# Patient Record
Sex: Female | Born: 1975 | Race: White | Hispanic: No | Smoking: Never smoker
Health system: Southern US, Community
[De-identification: ages and names within clinical notes are randomized; demographics above are authoritative.]

## PROBLEM LIST (undated history)

## (undated) DIAGNOSIS — F909 Attention-deficit hyperactivity disorder, unspecified type: Secondary | ICD-10-CM

## (undated) DIAGNOSIS — I872 Venous insufficiency (chronic) (peripheral): Secondary | ICD-10-CM

## (undated) DIAGNOSIS — D649 Anemia, unspecified: Secondary | ICD-10-CM

## (undated) DIAGNOSIS — Z8742 Personal history of other diseases of the female genital tract: Secondary | ICD-10-CM

## (undated) DIAGNOSIS — G43909 Migraine, unspecified, not intractable, without status migrainosus: Secondary | ICD-10-CM

## (undated) DIAGNOSIS — T7840XA Allergy, unspecified, initial encounter: Secondary | ICD-10-CM

## (undated) DIAGNOSIS — F32A Depression, unspecified: Secondary | ICD-10-CM

## (undated) DIAGNOSIS — F329 Major depressive disorder, single episode, unspecified: Secondary | ICD-10-CM

## (undated) DIAGNOSIS — D259 Leiomyoma of uterus, unspecified: Secondary | ICD-10-CM

## (undated) DIAGNOSIS — N809 Endometriosis, unspecified: Secondary | ICD-10-CM

## (undated) DIAGNOSIS — F419 Anxiety disorder, unspecified: Secondary | ICD-10-CM

## (undated) HISTORY — DX: Venous insufficiency (chronic) (peripheral): I87.2

## (undated) HISTORY — PX: DILATION AND CURETTAGE OF UTERUS: SHX78

## (undated) HISTORY — DX: Endometriosis, unspecified: N80.9

## (undated) HISTORY — DX: Depression, unspecified: F32.A

## (undated) HISTORY — DX: Migraine, unspecified, not intractable, without status migrainosus: G43.909

## (undated) HISTORY — DX: Major depressive disorder, single episode, unspecified: F32.9

## (undated) HISTORY — DX: Depression, unspecified: F41.9

## (undated) HISTORY — DX: Leiomyoma of uterus, unspecified: D25.9

## (undated) HISTORY — PX: BREAST LUMPECTOMY W/ NEEDLE LOCALIZATION: SHX1266

## (undated) HISTORY — DX: Attention-deficit hyperactivity disorder, unspecified type: F90.9

## (undated) HISTORY — DX: Allergy, unspecified, initial encounter: T78.40XA

## (undated) HISTORY — PX: TONSILLECTOMY: SUR1361

## (undated) HISTORY — DX: Anemia, unspecified: D64.9

## (undated) HISTORY — DX: Personal history of other diseases of the female genital tract: Z87.42

---

## 1997-10-19 ENCOUNTER — Inpatient Hospital Stay (HOSPITAL_COMMUNITY): Admission: AD | Admit: 1997-10-19 | Discharge: 1997-10-22 | Payer: Self-pay | Admitting: Obstetrics and Gynecology

## 1998-09-07 ENCOUNTER — Inpatient Hospital Stay (HOSPITAL_COMMUNITY): Admission: AD | Admit: 1998-09-07 | Discharge: 1998-09-09 | Payer: Self-pay | Admitting: Obstetrics and Gynecology

## 1999-01-26 ENCOUNTER — Encounter (INDEPENDENT_AMBULATORY_CARE_PROVIDER_SITE_OTHER): Payer: Self-pay

## 1999-01-26 ENCOUNTER — Other Ambulatory Visit: Admission: RE | Admit: 1999-01-26 | Discharge: 1999-01-26 | Payer: Self-pay | Admitting: *Deleted

## 1999-08-17 ENCOUNTER — Encounter (INDEPENDENT_AMBULATORY_CARE_PROVIDER_SITE_OTHER): Payer: Self-pay | Admitting: Specialist

## 1999-08-17 ENCOUNTER — Inpatient Hospital Stay (HOSPITAL_COMMUNITY): Admission: AD | Admit: 1999-08-17 | Discharge: 1999-08-19 | Payer: Self-pay | Admitting: Obstetrics and Gynecology

## 1999-10-23 ENCOUNTER — Encounter: Payer: Self-pay | Admitting: Obstetrics and Gynecology

## 1999-10-23 ENCOUNTER — Ambulatory Visit (HOSPITAL_COMMUNITY): Admission: RE | Admit: 1999-10-23 | Discharge: 1999-10-23 | Payer: Self-pay | Admitting: Obstetrics and Gynecology

## 1999-11-15 ENCOUNTER — Other Ambulatory Visit: Admission: RE | Admit: 1999-11-15 | Discharge: 1999-11-15 | Payer: Self-pay | Admitting: Obstetrics and Gynecology

## 2000-10-21 ENCOUNTER — Other Ambulatory Visit: Admission: RE | Admit: 2000-10-21 | Discharge: 2000-10-21 | Payer: Self-pay | Admitting: Obstetrics and Gynecology

## 2000-10-22 ENCOUNTER — Encounter (INDEPENDENT_AMBULATORY_CARE_PROVIDER_SITE_OTHER): Payer: Self-pay

## 2000-10-22 ENCOUNTER — Ambulatory Visit (HOSPITAL_COMMUNITY): Admission: RE | Admit: 2000-10-22 | Discharge: 2000-10-22 | Payer: Self-pay | Admitting: Obstetrics and Gynecology

## 2002-08-16 ENCOUNTER — Emergency Department (HOSPITAL_COMMUNITY): Admission: EM | Admit: 2002-08-16 | Discharge: 2002-08-16 | Payer: Self-pay

## 2002-10-14 ENCOUNTER — Other Ambulatory Visit: Admission: RE | Admit: 2002-10-14 | Discharge: 2002-10-14 | Payer: Self-pay | Admitting: Obstetrics and Gynecology

## 2003-05-05 ENCOUNTER — Inpatient Hospital Stay (HOSPITAL_COMMUNITY): Admission: AD | Admit: 2003-05-05 | Discharge: 2003-05-05 | Payer: Self-pay | Admitting: Obstetrics and Gynecology

## 2003-06-06 ENCOUNTER — Inpatient Hospital Stay (HOSPITAL_COMMUNITY): Admission: AD | Admit: 2003-06-06 | Discharge: 2003-06-08 | Payer: Self-pay | Admitting: Obstetrics and Gynecology

## 2003-07-13 ENCOUNTER — Other Ambulatory Visit: Admission: RE | Admit: 2003-07-13 | Discharge: 2003-07-13 | Payer: Self-pay | Admitting: Obstetrics and Gynecology

## 2005-01-03 ENCOUNTER — Ambulatory Visit: Payer: Self-pay | Admitting: Family Medicine

## 2005-07-09 ENCOUNTER — Ambulatory Visit: Payer: Self-pay | Admitting: Family Medicine

## 2005-11-13 ENCOUNTER — Ambulatory Visit: Payer: Self-pay | Admitting: Family Medicine

## 2006-01-08 ENCOUNTER — Ambulatory Visit: Payer: Self-pay | Admitting: Family Medicine

## 2006-01-15 ENCOUNTER — Ambulatory Visit: Payer: Self-pay | Admitting: Internal Medicine

## 2006-01-21 ENCOUNTER — Ambulatory Visit: Payer: Self-pay | Admitting: Family Medicine

## 2006-01-21 ENCOUNTER — Encounter: Admission: RE | Admit: 2006-01-21 | Discharge: 2006-01-21 | Payer: Self-pay | Admitting: Family Medicine

## 2006-01-24 ENCOUNTER — Ambulatory Visit: Payer: Self-pay | Admitting: Family Medicine

## 2006-01-28 ENCOUNTER — Ambulatory Visit: Payer: Self-pay | Admitting: Family Medicine

## 2006-02-05 ENCOUNTER — Encounter: Admission: RE | Admit: 2006-02-05 | Discharge: 2006-02-05 | Payer: Self-pay | Admitting: Family Medicine

## 2006-02-05 ENCOUNTER — Ambulatory Visit: Payer: Self-pay | Admitting: Family Medicine

## 2006-02-25 ENCOUNTER — Encounter: Admission: RE | Admit: 2006-02-25 | Discharge: 2006-02-25 | Payer: Self-pay | Admitting: Allergy and Immunology

## 2006-03-01 ENCOUNTER — Ambulatory Visit: Payer: Self-pay | Admitting: Family Medicine

## 2006-09-16 ENCOUNTER — Ambulatory Visit: Payer: Self-pay | Admitting: Family Medicine

## 2006-11-26 DIAGNOSIS — J45909 Unspecified asthma, uncomplicated: Secondary | ICD-10-CM | POA: Insufficient documentation

## 2007-06-17 ENCOUNTER — Ambulatory Visit: Payer: Self-pay | Admitting: Family Medicine

## 2007-06-17 DIAGNOSIS — J029 Acute pharyngitis, unspecified: Secondary | ICD-10-CM | POA: Insufficient documentation

## 2007-06-17 DIAGNOSIS — R609 Edema, unspecified: Secondary | ICD-10-CM | POA: Insufficient documentation

## 2007-06-17 LAB — CONVERTED CEMR LAB: Rapid Strep: NEGATIVE

## 2008-03-03 ENCOUNTER — Ambulatory Visit: Payer: Self-pay | Admitting: Family Medicine

## 2008-03-09 ENCOUNTER — Encounter (INDEPENDENT_AMBULATORY_CARE_PROVIDER_SITE_OTHER): Payer: Self-pay | Admitting: *Deleted

## 2008-03-09 ENCOUNTER — Telehealth: Payer: Self-pay | Admitting: Family Medicine

## 2008-03-29 ENCOUNTER — Ambulatory Visit: Payer: Self-pay | Admitting: Surgery

## 2008-07-19 DIAGNOSIS — J069 Acute upper respiratory infection, unspecified: Secondary | ICD-10-CM | POA: Insufficient documentation

## 2008-07-26 ENCOUNTER — Ambulatory Visit: Payer: Self-pay | Admitting: Family Medicine

## 2008-08-13 IMAGING — CR DG CHEST 2V
2 series · 2 of 2 positions shown · non-contrast
Comparison: None.

CLINICAL DATA: Cough, chest pain, and wheezing.
 TWO VIEW CHEST:

[w chest pa]
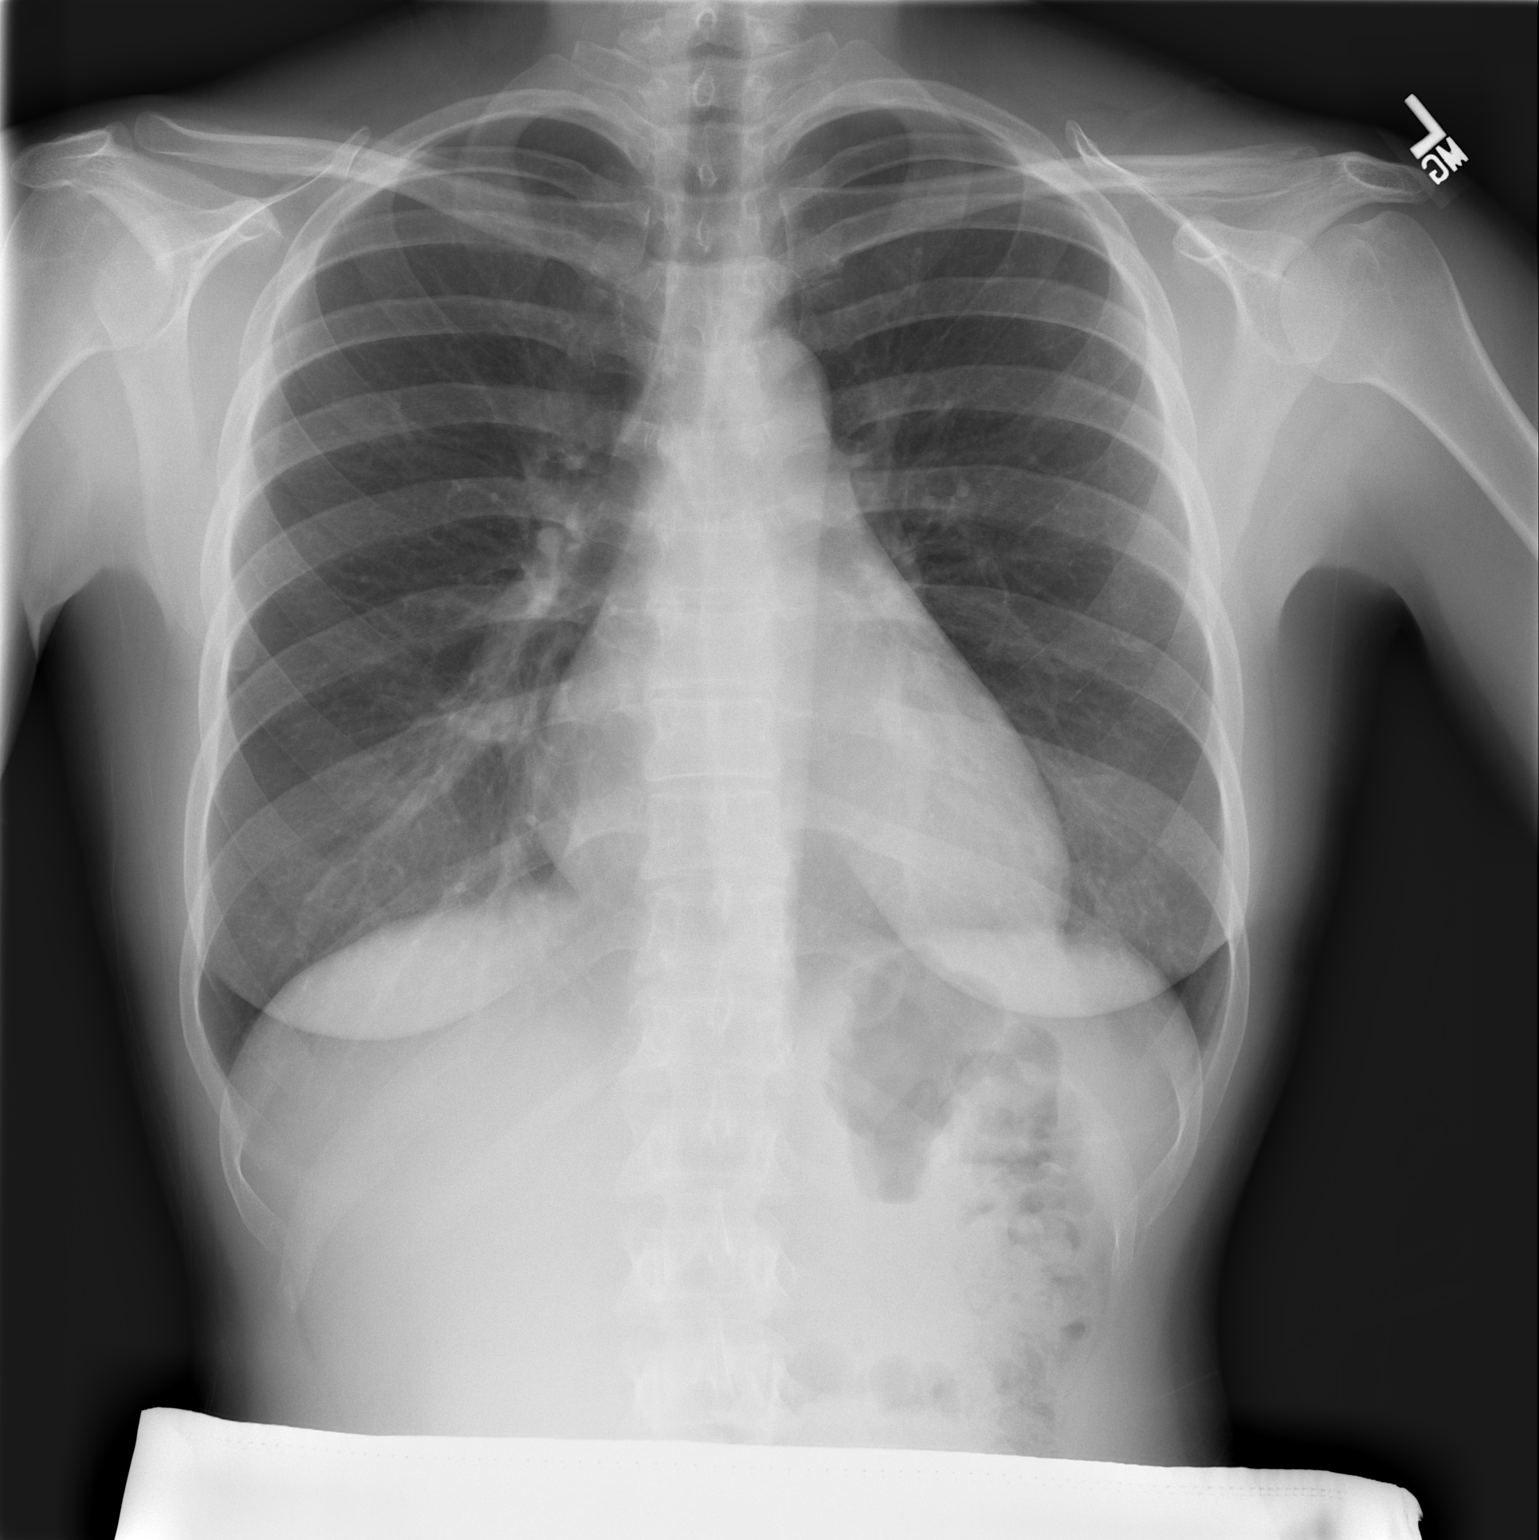

[w chest lat]
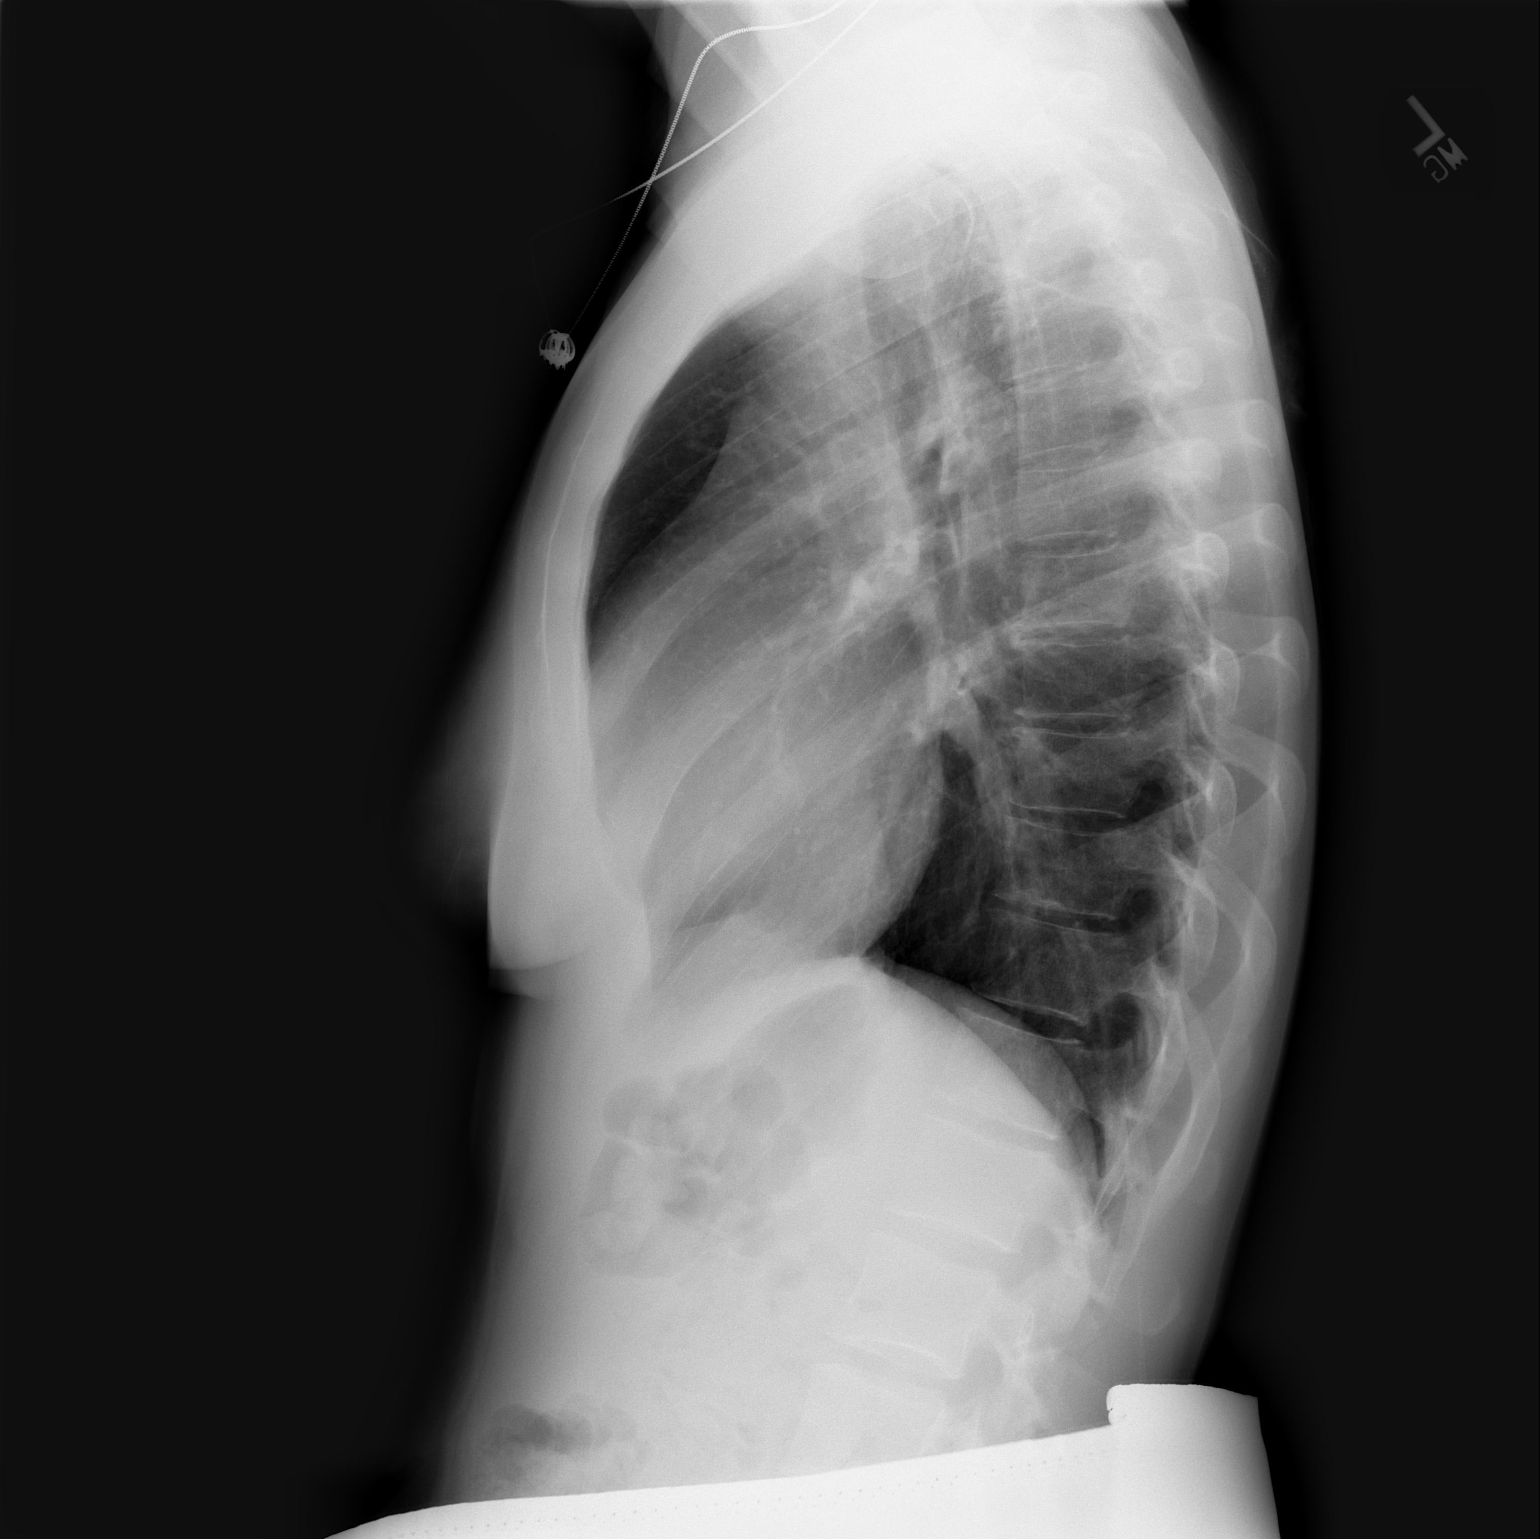

[2 of 2 positions shown; findings below may reference images not displayed]

Heart size is upper limits of normal.  Mild peribronchial thickening is noted without focal airspace disease.  A faint nodular opacity overlying the left upper lobe is identified. The lungs are otherwise clear.  No pleural effusions or pneumothorax.  The bony thorax and upper abdomen are within normal limits.
IMPRESSION: 1.  Mild peribronchial thickening without focal airspace disease.
 2.  Vaguely nodular opacity overlying the left upper lobe ? probably artifact but recommend follow-up chest x-ray in 8-12 weeks.
 3.  Upper limits of normal heart size.

## 2009-02-17 ENCOUNTER — Ambulatory Visit: Payer: Self-pay | Admitting: Family Medicine

## 2009-02-17 DIAGNOSIS — B070 Plantar wart: Secondary | ICD-10-CM | POA: Insufficient documentation

## 2009-09-13 ENCOUNTER — Ambulatory Visit: Payer: Self-pay | Admitting: Internal Medicine

## 2009-09-13 DIAGNOSIS — J309 Allergic rhinitis, unspecified: Secondary | ICD-10-CM | POA: Insufficient documentation

## 2009-12-26 ENCOUNTER — Ambulatory Visit: Payer: Self-pay | Admitting: Family Medicine

## 2009-12-26 DIAGNOSIS — R1011 Right upper quadrant pain: Secondary | ICD-10-CM | POA: Insufficient documentation

## 2009-12-26 DIAGNOSIS — G43109 Migraine with aura, not intractable, without status migrainosus: Secondary | ICD-10-CM | POA: Insufficient documentation

## 2009-12-26 LAB — CONVERTED CEMR LAB
ALT: 20 units/L (ref 0–35)
AST: 20 units/L (ref 0–37)
Albumin: 4.6 g/dL (ref 3.5–5.2)
Alkaline Phosphatase: 52 units/L (ref 39–117)
Amylase: 29 units/L (ref 27–131)
BUN: 13 mg/dL (ref 6–23)
Basophils Absolute: 0 10*3/uL (ref 0.0–0.1)
Basophils Relative: 0.4 % (ref 0.0–3.0)
Bilirubin Urine: NEGATIVE
Bilirubin, Direct: 0.2 mg/dL (ref 0.0–0.3)
Blood in Urine, dipstick: NEGATIVE
CO2: 29 meq/L (ref 19–32)
Calcium: 9.6 mg/dL (ref 8.4–10.5)
Chloride: 104 meq/L (ref 96–112)
Creatinine, Ser: 0.7 mg/dL (ref 0.4–1.2)
Eosinophils Absolute: 0 10*3/uL (ref 0.0–0.7)
Eosinophils Relative: 0.1 % (ref 0.0–5.0)
GFR calc non Af Amer: 109.84 mL/min (ref >60)
Glucose, Bld: 80 mg/dL (ref 70–99)
Glucose, Urine, Semiquant: NEGATIVE
HCT: 40.2 % (ref 36.0–46.0)
Hemoglobin: 13.9 g/dL (ref 12.0–15.0)
Ketones, urine, test strip: NEGATIVE
Lipase: 25 units/L (ref 11.0–59.0)
Lymphocytes Relative: 17.2 % (ref 12.0–46.0)
Lymphs Abs: 1.2 10*3/uL (ref 0.7–4.0)
MCHC: 34.5 g/dL (ref 30.0–36.0)
MCV: 92.8 fL (ref 78.0–100.0)
Monocytes Absolute: 0.4 10*3/uL (ref 0.1–1.0)
Monocytes Relative: 5.3 % (ref 3.0–12.0)
Neutro Abs: 5.6 10*3/uL (ref 1.4–7.7)
Neutrophils Relative %: 77 % (ref 43.0–77.0)
Nitrite: NEGATIVE
Platelets: 234 10*3/uL (ref 150.0–400.0)
Potassium: 3.7 meq/L (ref 3.5–5.1)
Protein, U semiquant: NEGATIVE
RBC: 4.34 M/uL (ref 3.87–5.11)
RDW: 12.5 % (ref 11.5–14.6)
Sodium: 140 meq/L (ref 135–145)
Specific Gravity, Urine: <1.005
Total Bilirubin: 1.1 mg/dL (ref 0.3–1.2)
Total Protein: 7.1 g/dL (ref 6.0–8.3)
Urobilinogen, UA: 0.2
WBC Urine, dipstick: NEGATIVE
WBC: 7.3 10*3/uL (ref 4.5–10.5)
pH: 5.5

## 2009-12-27 ENCOUNTER — Encounter: Admission: RE | Admit: 2009-12-27 | Discharge: 2009-12-27 | Payer: Self-pay | Admitting: Family Medicine

## 2009-12-27 ENCOUNTER — Telehealth: Payer: Self-pay | Admitting: Family Medicine

## 2009-12-28 ENCOUNTER — Encounter: Payer: Self-pay | Admitting: Family Medicine

## 2009-12-29 ENCOUNTER — Telehealth: Payer: Self-pay | Admitting: Gastroenterology

## 2009-12-29 ENCOUNTER — Ambulatory Visit (HOSPITAL_COMMUNITY): Admission: RE | Admit: 2009-12-29 | Discharge: 2009-12-29 | Payer: Self-pay | Admitting: Family Medicine

## 2009-12-29 ENCOUNTER — Ambulatory Visit: Payer: Self-pay | Admitting: Gastroenterology

## 2010-01-03 ENCOUNTER — Telehealth: Payer: Self-pay | Admitting: Gastroenterology

## 2010-01-04 ENCOUNTER — Telehealth: Payer: Self-pay | Admitting: Family Medicine

## 2010-01-05 ENCOUNTER — Encounter: Payer: Self-pay | Admitting: Family Medicine

## 2010-04-18 NOTE — Assessment & Plan Note (Signed)
Summary: legs swollen/mhf   Vital Signs:  Patient Profile:   35 Years Old Female Weight:      163 pounds Temp:     98.4 degrees F oral BP sitting:   142 / 90  (left arm) Cuff size:   regular  Vitals Entered By: Carol Beasley CMA (March 03, 2008 8:53 AM)                 Chief Complaint:  swelling with legs.  History of Present Illness: Carol Beasley is a 35 year old, married female, [employer] firefighter, G4, P4, who comes in today with a 6 month history of bilateral leg edema.  We noticed this last spring.  We gave her HCTZ, 25 mg to take p.r.n.  She took a p.r.n. but it didn't help.  She then took a daily and her legs still swell  She is  on a salt free diet.  She's never had trouble with varicose veins.  Her father and a brother both have hypertension. she's otherwise in excellent health.  Her husband had a vasectomy.    Updated Prior Medication List: ALBUTEROL 90 MCG/ACT  AERS (ALBUTEROL) as needed HYDROCHLOROTHIAZIDE 25 MG  TABS (HYDROCHLOROTHIAZIDE) Take 1 tablet by mouth every morning * IRON take one tab two every other day * BIO FLEX take one tablet once daily  Current Allergies: No known allergies   Past Medical History:    Reviewed history from 06/17/2007 and no changes required:       Asthma       childbirth x 4  Past Surgical History:    Reviewed history from 11/26/2006 and no changes required:       CB X4   Family History:    Reviewed history from 06/17/2007 and no changes required:       father in good health.  No problems       mother has hypertension       one brother two sisters, all in good health       Family History Hypertension  Social History:    Reviewed history from 06/17/2007 and no changes required:       Occupation: fireman       Married       Never Smoked       Alcohol use-no       Drug use-no       Regular exercise-yes    Review of Systems      See HPI   Physical Exam  General:     Well-developed,well-nourished,in no acute  distress; alert,appropriate and cooperative throughout examination Neck:     No deformities, masses, or tenderness noted. Chest Wall:     No deformities, masses, or tenderness noted. Lungs:     Normal respiratory effort, chest expands symmetrically. Lungs are clear to auscultation, no crackles or wheezes. Heart:     Normal rate and regular rhythm. S1 and S2 normal without gallop, murmur, click, rub or other extra sounds. 160/90 right arm sitting position 150/9090 left arm sitting position Abdomen:     Bowel sounds positive,abdomen soft and non-tender without masses, organomegaly or hernias noted. Extremities:     1+ left pedal edema and trace right pedal edema.      Impression & Recommendations:  Problem # 1:  PERIPHERAL EDEMA (ICD-782.3) Assessment: Deteriorated  Her updated medication list for this problem includes:    Hydrochlorothiazide 25 Mg Tabs (Hydrochlorothiazide) .Marland Kitchen... Take 1 tablet by mouth every morning    Furosemide 20 Mg Tabs (  Furosemide) .Marland Kitchen... Take 1 tablet by mouth every morning   Complete Medication List: 1)  Albuterol 90 Mcg/act Aers (Albuterol) .... As needed 2)  Hydrochlorothiazide 25 Mg Tabs (Hydrochlorothiazide) .... Take 1 tablet by mouth every morning 3)  Iron  .... Take one tab two every other day 4)  Bio Flex  .... Take one tablet once daily 5)  Furosemide 20 Mg Tabs (Furosemide) .... Take 1 tablet by mouth every morning   Patient Instructions: 1)  stop the HCTZ begin Lasix 20 mg every morning.  Stent a complete salt free diet.  Check a morning blood pressure daily.  Return in 4 weeks for follow-up when he returned to bring a record of all your blood pressure readings   Prescriptions: FUROSEMIDE 20 MG TABS (FUROSEMIDE) Take 1 tablet by mouth every morning  #100 x 3   Entered and Authorized by:   Roderick Pee MD   Signed by:   Roderick Pee MD on 03/03/2008   Method used:   [facility] to        [facility] [phone][facility] (retail)       [address] [place], [place]  [address]       Ph: [phone] or [phone]       Fax: [phone]   RxID:   [id number]  ]

## 2010-04-18 NOTE — Progress Notes (Signed)
Summary: How pt is doing  ---- Converted from flag ---- ---- 12/30/2009 10:35 AM, Merri Ray CMA (AAMA) wrote: Call pt to see how she is doing. Dr Arlyce Dice wants to know ------------------------------  Phone Note Outgoing Call Call back at [facility] Phone [phone]   Call placed by: Merri Ray CMA Duncan Dull),  January 03, 2010 4:31 PM Summary of Call: Called pt to ask how she is doing no answer, L/M to return my call Initial call taken by: Merri Ray CMA Duncan Dull),  January 03, 2010 4:32 PM  Follow-up for Phone Call        tried to contact pt again L/M Follow-up by: Merri Ray CMA Duncan Dull),  January 04, 2010 10:04 AM  Additional Follow-up for Phone Call Additional follow up Details #1::        ok Additional Follow-up by: Louis Meckel MD,  January 04, 2010 11:13 AM    Additional Follow-up for Phone Call Additional follow up Details #2::    Waiting on call back.... Dr Arlyce Dice aware Follow-up by: Merri Ray CMA Duncan Dull),  January 04, 2010 1:43 PM

## 2010-04-18 NOTE — Assessment & Plan Note (Signed)
Summary: sore throat/mhf   Vital Signs:  Patient Profile:   35 Years Old Female Weight:      152.5 pounds Temp:     98.2 degrees F BP sitting:   130 / 62  Vitals Entered By: Sindy Guadeloupe RN (June 17, 2007 9:26 AM)                 Chief Complaint:  sore throat.  History of Present Illness: Carol Beasley is a 35 year old female, G4, P4 whose husbands have a vasectomy, who comes in today for evaluation of two problems.  She's had a sore throat, fever, chills, swollen lymph nodes for 3 days.  She does not have a headache, earache, cough, nausea, vomiting, or diarrhea.  All 4 children have been well.  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  She's also complaining of some fluid retention.  He used it occurs a week before her period.    Current Allergies (reviewed today): No known allergies   Past Medical History:    Reviewed history from 11/26/2006 and no changes required:       Asthma       childbirth x 4   Family History:    Reviewed history and no changes required:       father in good health.  No problems       mother has hypertension       one brother two sisters, all in good health  Social History:    Occupation: fireman    Married    Never Smoked    Alcohol use-no    Drug use-no    Regular exercise-yes   Risk Factors:  Tobacco use:  never Drug use:  no Alcohol use:  no Exercise:  yes   Review of Systems      See HPI   Physical Exam  General:     Well-developed,well-nourished,in no acute distress; alert,appropriate and cooperative throughout examination Head:     Normocephalic and atraumatic without obvious abnormalities. No apparent alopecia or balding. Eyes:     No corneal or conjunctival inflammation noted. EOMI. Perrla. Funduscopic exam benign, without hemorrhages, exudates or papilledema. Vision grossly normal. Ears:     External ear exam shows no significant  lesions or deformities.  Otoscopic examination reveals clear canals, tympanic membranes are intact bilaterally without bulging, retraction, inflammation or discharge. Hearing is grossly normal bilaterally. Nose:     External nasal examination shows no deformity or inflammation. Nasal mucosa are pink and moist without lesions or exudates. Mouth:     the throat is red and swollen.  No petechiae nor exudate Neck:     symmetrical 2+ cervical lymphadenopathy Pulses:     R and L carotid,radial,femoral,dorsalis pedis and posterior tibial pulses are full and equal bilaterally Extremities:     No clubbing, cyanosis, edema, or deformity noted with normal full range of motion of all joints.      Impression & Recommendations:  Problem # 1:  SORE THROAT (ICD-462) Assessment: New  Orders: Rapid Strep ([address])  Her updated medication list for this problem includes:    Amoxicillin 400 Mg Chew (Amoxicillin) .Marland Kitchen... Take 1 tablet by mouth three times a day   Problem # 2:  PERIPHERAL EDEMA (ICD-782.3) Assessment: New  Her updated medication list for this problem includes:    Hydrochlorothiazide 25 Mg Tabs (Hydrochlorothiazide) .Marland Kitchen... Take 1 tablet by mouth every morning  Complete Medication List: 1)  Albuterol 90 Mcg/act Aers (Albuterol) .... As needed 2)  Amoxicillin 400 Mg Chew (Amoxicillin) .... Take 1 tablet by mouth three times a day 3)  Hydrochlorothiazide 25 Mg Tabs (Hydrochlorothiazide) .... Take 1 tablet by mouth every morning   Patient Instructions: 1)  take amoxicillin chewable 13 times a day for 10 days. 2)  He may also take HCTZ 25 mg every morning as needed for fluid retention    Prescriptions: HYDROCHLOROTHIAZIDE 25 MG  TABS (HYDROCHLOROTHIAZIDE) Take 1 tablet by mouth every morning  #100 x 4   Entered and Authorized by:   Roderick Pee MD   Signed by:   Roderick Pee MD on 06/17/2007   Method used:   [facility] to ...       [id number]       [address]       [place], [place]  [address]       Ph: [phone] or [phone]       Fax: [phone]   RxID:   [id number] AMOXICILLIN 400 MG  CHEW (AMOXICILLIN) Take 1 tablet by mouth three times a day  #30 x 1   Entered and Authorized by:   Roderick Pee MD   Signed by:   Roderick Pee MD on 06/17/2007   Method used:   Electronically sent to ...       [facility] [phone][facility]       [address]       [place], [place]  [address]       Ph: [phone] or [phone]       Fax: [phone]   RxID:   [id number]  ]Pharmacy called and switched the medication to Amoxicillin 400/17ml suspension 1 tsp three times a day x10days Laboratory Results   Date/Time Reported: June 17, 2007 9:50 AM   Other Tests  Rapid Strep: negative Comments ..................................................................Marland KitchenWynona Canes, CMA  June 17, 2007 9:50 AM

## 2010-04-18 NOTE — Assessment & Plan Note (Signed)
Summary: ABD PAIN,NAUSEA UNABLE TO EAT/YF   History of Present Illness Visit Type: Initial Consult Primary GI MD: Melvia Heaps MD [facility] Primary Provider: Kelle Darting, MD Requesting Provider: Kelle Darting, MD Chief Complaint: RUQ pain, with nausea ; patient can eat only small amount of food History of Present Illness:   Carol Beasley is a 35 year old white female referred at the request of Dr. Tawanna Cooler for evaluation of nausea.  For the past 2 weeks she has been complaining of right upper quadrant pain.  Pain is only of mild to moderate intensity.  It is unaffected by eating.  She underwent an ultrasound that was unrevealing.  Pain has lessened to some degree and is now described as an aching pain.  Over the past several days she has had severe nausea.  Nausea has been around-the-clock and not affected by eating.  She has a history of migraine headaches but has had no recent severe headaches.  She has not had nausea accompaning  the headaches in the past.  She is on no gastric irritants nor on other new medications or supplements.  She is without fever.  Recent liver tests and CBC were normal.   GI Review of Systems    Reports abdominal pain, loss of appetite, and  nausea.     Location of  Abdominal pain: RUQ.    Denies acid reflux, belching, bloating, chest pain, dysphagia with liquids, dysphagia with solids, heartburn, vomiting, vomiting blood, weight loss, and  weight gain.        Denies anal fissure, black tarry stools, change in bowel habit, constipation, diarrhea, diverticulosis, fecal incontinence, heme positive stool, hemorrhoids, irritable bowel syndrome, jaundice, light color stool, liver problems, rectal bleeding, and  rectal pain.    Current Medications (verified): 1)  Albuterol 90 Mcg/act  Aers (Albuterol) .... As Needed 2)  Iron .... Take One Tab Two Every Other Day 3)  Zomig Zmt 2.5 Mg Tbdp (Zolmitriptan) .... Uad For  Migraines  Allergies (verified): No Known Drug  Allergies  Past History:  Past Medical History: Asthma childbirth x 4 Chronic Headaches  Past Surgical History: Reviewed history from 11/26/2006 and no changes required. CB X4  Family History: Reviewed history from 03/03/2008 and no changes required. father in good health.  No problems mother has hypertension one brother two sisters, all in good health Family History Hypertension  Social History: Reviewed history from 06/17/2007 and no changes required. 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  The patient denies allergy/sinus, anemia, anxiety-new, arthritis/joint pain, blood in urine, breast changes/lumps, change in vision, confusion, cough, coughing up blood, depression-new, fainting, fever, hearing problems, heart murmur, heart rhythm changes, itching, menstrual pain, muscle pains/cramps, night sweats, nosebleeds, pregnancy symptoms, shortness of breath, skin rash, sleeping problems, sore throat, swelling of feet/legs, swollen lymph glands, thirst - excessive , urination - excessive , urination changes/pain, urine leakage, vision changes, and voice change.         All other systems were reviewed and were negative   Vital Signs:  Patient profile:   35 year old female Height:      64 inches Weight:      153.13 pounds BMI:     26.38 Pulse rate:   72 / minute Pulse rhythm:   regular BP sitting:   110 / 80  (left arm) Cuff size:   regular  Vitals Entered  By: June McMurray CMA Duncan Dull) (December 29, 2009 9:01 AM)  Physical Exam  Additional Exam:  On physical exam she is well-developed large female  skin: anicteric HEENT: normocephalic; PEERLA; no nasal or pharyngeal abnormalities neck: supple nodes: no cervical lymphadenopathy chest: clear to ausculatation and percussion heart: no murmurs, gallops, or rubs abd: soft, nontender;  BS normoactive; no abdominal masses, organomegaly; there is a mild tenderness to palpation in the right upper quadrant at the subcostal margin.  There is no succussion splash. rectal: deferred ext: no cynanosis, clubbing, edema skeletal: no deformities neuro: oriented x 3; no focal abnormalities    Impression & Recommendations:  Problem # 1:  ABDOMINAL PAIN, RIGHT UPPER QUADRANT (ICD-789.01) Assessment New Abdominal pain and nausea could be a manifestation of a chronic cholecystitis, even in the absence of gallbladder stones.  Alternatively, in particular her nausea may be a migraine equivalent.  Recommendations #1 HIDA scan-this is been ordered for today #2 Zofran for nausea #3 if her scan is negative and symptoms not improved with Zofran  I would consider aggressively treatment for her  migraine headaches.  I would do an EGD first  Patient Instructions: 1)  Copy sent to : Kelle Darting, MD 2)  Please make sure that we recieve results of HIDA scan 3)  Call the office tomorrow and let us know how your doing 4)  You can pick up your new prescription from your pharmacy today 5)  The medication list was reviewed and reconciled.  All changed / newly prescribed medications were explained.  A complete medication list was provided to the patient / caregiver. Prescriptions: ZOFRAN 4 MG TABS (ONDANSETRON HCL) take one tab every 6 hours as needed for nausea  #20 x 1   Entered by:   Merri Ray CMA (AAMA)   Authorized by:   Louis Meckel MD   Signed by:   Merri Ray CMA (AAMA) on 12/29/2009   Method used:   [facility]        [facility] [phone][facility] ([facility]ail[facility]       [address] [place], [place]  [address]       Ph: [phone] or [phone]       Fax: [phone]   RxID:   [id number]

## 2010-04-18 NOTE — Miscellaneous (Signed)
Summary: Orders Update   Clinical Lists Changes  Orders: Added new Referral order of Radiology Referral (Radiology) - Signed 

## 2010-04-18 NOTE — Miscellaneous (Signed)
Summary: Orders Update  Clinical Lists Changes  Orders: Added new Referral order of Vascular Clinic (Vascular) - Signed

## 2010-04-18 NOTE — Assessment & Plan Note (Signed)
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  She states the past two weeks has had intermittent right upper quadrant abdominal pain.  She describes it as sometimes sharp sometimes dull.  It lasted 15 minutes and goes away.  Occasional wake her up at night.  She's had no fever, chills, nausea, vomiting, diarrhea, or urinary tract symptoms.  LMP two weeks ago, normal.  Negative family history for gallbladder disease.  She has light scan light.  Eyes and 4 children.  She's also complaining of increasing frequency of her migraines.  She is taking over-the-counter medications to no avail.  She does have a visual aura prior to the migraines  Current Medications (verified): 1)  Albuterol 90 Mcg/act  Aers (Albuterol) .... As Needed 2)  Iron .... Take One Tab Two Every Other Day  Allergies (verified): No Known Drug Allergies  Past History:  Past medical, surgical, family and social histories (including risk factors) reviewed, and no changes noted (except as noted below).  Past Medical History: Reviewed history from 06/17/2007 and no changes required. Asthma childbirth x 4  Past Surgical History: Reviewed history from 11/26/2006 and no changes required. CB X4  Family History: Reviewed history from 03/03/2008 and no changes required. father in good health.  No problems mother has hypertension one brother two sisters, all in good health Family History Hypertension  Social History: Reviewed history from 06/17/2007 and no changes required. 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 Lungs are clear to auscultation, no crackles or wheezes.   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 Uad for  migraines  Problem # 2:  ABDOMINAL PAIN, RIGHT UPPER QUADRANT (ICD-789.01) Assessment: New  Orders: Venipuncture ([address]) TLB-BMP (Basic Metabolic Panel-BMET) (80048-METABOL) TLB-CBC Platelet - w/Differential (85025-CBCD) TLB-Hepatic/Liver Function Pnl (80076-HEPATIC) TLB-Amylase (82150-AMYL) Radiology Referral (Radiology)  Complete Medication List: 1)  Albuterol 90 Mcg/act Aers (Albuterol) .... As needed 2)  Iron  .... Take one tab two every other day 3)  Zomig Zmt 2.5 Mg Tbdp (Zolmitriptan) .... Uad for  migraines  Other Orders: UA Dipstick w/o Micro (manual) ([address]) TLB-Lipase (83690-LIPASE) Prescription Created Electronically [phone])  Patient Instructions: 1)  stay on  a complete fat free diet...Marland KitchenMarland KitchenMarland Kitchenwe will get she is set up for an ultrasound of her gallbladder in the morning. 2)  Zomig or  Imitrex p.r.n. for migraines Prescriptions: ZOMIG ZMT 2.5 MG TBDP (ZOLMITRIPTAN) UAD for  migraines  #6 x 11   Entered and  Authorized by:   Roderick Pee MD   Signed by:   Roderick Pee MD on 12/26/2009   Method used:   [facility]        [facility] [phone][facility] ([facility]ail[facility]       [address] [place], [place]   [address]       Ph: [phone] or [phone]       Fax: [phone]   RxID:   [id number]   Laboratory Results   Urine Tests  Date/Time Recieved: December 26, 2009 11:14 AM  Date/Time Reported: December 26, 2009 11:13 AM   Routine Urinalysis   Color: yellow Appearance: Clear Glucose: negative   (Normal Range: Negative) Bilirubin: negative   (Normal Range: Negative) Ketone: negative   (Normal Range: Negative) Spec. Gravity: <1.005   (Normal Range: 1.003-1.035) Blood: negative   (Normal Range: Negative) pH: 5.5   (Normal Range: 5.0-8.0) Protein: negative   (Normal Range: Negative) Urobilinogen: 0.2   (Normal Range: 0-1) Nitrite: negative   (Normal Range: Negative) Leukocyte Esterace: negative   (Normal Range: Negative)    Comments: Wynona Canes, CMA  December 26, 2009 11:14 AM

## 2010-04-18 NOTE — Letter (Signed)
Summary: Results Letter  [facility]  [address] [place], [place] [address]   Phone: [phone]  Fax: [phone]        December 29, 2009 MRN: [id number]    [facility] [address] [place], [place]  [address]    Dear Ms. Poster,  It is my pleasure to have treated you recently as a new patient in my office. I appreciate your confidence and the opportunity to participate in your care.  Since I do have a busy inpatient endoscopy schedule and office schedule, my office hours vary weekly. I am, however, available for emergency calls everyday through my office. If I am not available for an urgent office appointment, another one of our gastroenterologist will be able to assist you.  My well-trained staff are prepared to help you at all times. For emergencies after office hours, a physician from our Gastroenterology section is always available through my 24 hour answering service  Once again I welcome you as a new patient and I look forward to a happy and healthy relationship             Sincerely,  Louis Meckel MD  This letter has been electronically signed by your physician.  Appended Document: Results Letter letter mailed

## 2010-04-18 NOTE — Progress Notes (Signed)
Summary: Called pt to inform  ---- Converted from flag ---- ---- 12/29/2009 4:45 PM, Louis Meckel MD wrote: Please ask patient to call back early next week to report her progress.  If not improved I would schedule an upper endoscopy.  Please tell her that her scan was normal. ------------------------------  Phone Note Outgoing Call Call back at [facility] Phone [phone]   Call placed by: Merri Ray CMA Duncan Dull),  December 29, 2009 4:54 PM Summary of Call: Called pt to inform to contact the office in 2 weeks and let us know how she is doing. Will schedule Upper Endoscopy if no better Initial call taken by: Merri Ray CMA Duncan Dull),  December 29, 2009 4:54 PM  Follow-up for Phone Call        I would have her call early next week Follow-up by: Louis Meckel MD,  December 30, 2009 10:31 AM  Additional Follow-up for Phone Call Additional follow up Details #1::        Ok I will contact the pt the first of next week myself. Will send reminder flag. Additional Follow-up by: Merri Ray CMA Duncan Dull),  December 30, 2009 10:34 AM

## 2010-04-18 NOTE — Medication Information (Signed)
Summary: PA & Approval for Zomig  PA & Approval for Zomig   Imported By: Maryln Gottron 01/09/2010 14:39:50  _____________________________________________________________________  External Attachment:    Type:   Image     Comment:   External Document

## 2010-04-18 NOTE — Assessment & Plan Note (Signed)
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  He comes in today for treatment of a wart on the right foot.  Allergies: No Known Drug Allergies  Physical Exam  General:  Well-developed,well-nourished,in no acute distress; alert,appropriate and cooperative throughout examination Skin:  plantar wart, right foot   Impression & Recommendations:  Problem # 1:  PLANTAR WART, RIGHT (GUY-403.47) Assessment New  Orders: Cryotherapy/Destruction benign or premalignant lesion (1st lesion)  (17000)  Complete Medication List: 1)  Albuterol 90 Mcg/act Aers (Albuterol) .... As needed 2)  Iron  .... Take one tab two every other day 3)  Qvar 40 Mcg/act Aers (Beclomethasone dipropionate) .... 2 ps two times a day 4)  Hydromet 5-1.5 Mg/8ml Syrp (Hydrocodone-homatropine) .Marland Kitchen.. 1 or 2 tsps at bedtime as needed  Patient Instructions: 1)  apply Duofilm and shaved weekly return p.r.n.

## 2010-04-18 NOTE — Progress Notes (Signed)
Summary: abd ultrasound  Phone Note Call from Patient Call back at [facility] Phone [phone] Call back at Work Phone [phone]   Caller: Patient Call For: Roderick Pee MD Summary of Call: pt would like abd ultrasound results Initial call taken by: Heron Sabins,  December 27, 2009 4:54 PM  Follow-up for Phone Call        the patient was contacted by me.  Her diagnostic studies were all normal will refer to GI for further evaluation Follow-up by: Roderick Pee MD,  December 27, 2009 5:08 PM

## 2010-04-18 NOTE — Assessment & Plan Note (Signed)
Summary: sinuses//ccm   Vital Signs:  Patient profile:   35 year old female Weight:      157 pounds Temp:     98.0 degrees F oral BP sitting:   118 / 80  (left arm) Cuff size:   regular  Vitals Entered By: Duard Brady LPN (September 13, 2009 10:10 AM) CC: c/o sinus pressure and headache , (R) ear pain Is Patient Diabetic? No   CC:  c/o sinus pressure and headache  and (R) ear pain.  History of Present Illness: 35 year old patient who has had sinus difficulties for approximate one month and she is evaluated at an urgent care.  Four weeks ago and has been on topical otic drops, as well as systemic anabolic therapy with amoxicillin.  She denies any fever or purulent sinus or ear drainage.  Complains include periodic headache, sinus pressure, and a popping and fullness in the right ear.  She has a history of asthma, which has been stable  Preventive Screening-Counseling & Management  Alcohol-Tobacco     Smoking Status: never  Allergies (verified): No Known Drug Allergies  Past History:  Past Medical History: Reviewed history from 06/17/2007 and no changes required. Asthma childbirth x 4  Physical Exam  General:  Well-developed,well-nourished,in no acute distress; alert,appropriate and cooperative throughout examination Head:  Normocephalic and atraumatic without obvious abnormalities. No apparent alopecia or balding. Eyes:  No corneal or conjunctival inflammation noted. EOMI. Perrla. Funduscopic exam benign, without hemorrhages, exudates or papilledema. Vision grossly normal. Ears:  External ear exam shows no significant lesions or deformities.  Otoscopic examination reveals clear canals, tympanic membranes are intact bilaterally without bulging, retraction, inflammation or discharge. Hearing is grossly normal bilaterally. both tympanic membranes appear to be mildly dull, but noninflamed Mouth:  Oral mucosa and oropharynx without lesions or exudates.  Teeth in good  repair. Neck:  No deformities, masses, or tenderness noted. Lungs:  Normal respiratory effort, chest expands symmetrically. Lungs are clear to auscultation, no crackles or wheezes.   Impression & Recommendations:  Problem # 1:  ASTHMA (ICD-493.90)  The following medications were removed from the medication list:    Qvar 40 Mcg/act Aers (Beclomethasone dipropionate) .Marland Kitchen... 2 ps two times a day Her updated medication list for this problem includes:    Albuterol 90 Mcg/act Aers (Albuterol) .Marland Kitchen... As needed  Problem # 2:  ALLERGIC RHINITIS (ICD-477.9) patient appears to have more allergic rhinitis or post inflammatory rhinitis.  No evidence of bacterial infection.  Will treated with  Depo-Medrol, and Allegra-D and provide samples of fluticasone nasal  Complete Medication List: 1)  Albuterol 90 Mcg/act Aers (Albuterol) .... As needed 2)  Iron  .... Take one tab two every other day 3)  Fexofenadine-pseudoephedrine 60-120 Mg Xr12h-tab (Fexofenadine-pseudoephedrine) .... One twice daily  Patient Instructions: 1)  Please schedule a follow-up appointment as needed. Prescriptions: ALBUTEROL 90 MCG/ACT  AERS (ALBUTEROL) as needed  #1 x 3   Entered and Authorized by:   Gordy Savers  MD   Signed by:   Gordy Savers  MD on 09/13/2009   Method used:   Print then Give to Patient   RxID:   [id number] FEXOFENADINE-PSEUDOEPHEDRINE 60-120 MG XR12H-TAB (FEXOFENADINE-PSEUDOEPHEDRINE) one twice daily  #30 x 4   Entered and Authorized by:   Gordy Savers  MD   Signed by:   Gordy Savers  MD on 09/13/2009   Method used:   Print then Give to Patient   RxID:   [id number]  Appended Document: sinuses//ccm     Allergies: No Known Drug Allergies   Complete Medication List: 1)  Albuterol 90 Mcg/act Aers (Albuterol) .... As needed 2)  Iron  .... Take one tab two every other day 3)  Fexofenadine-pseudoephedrine 60-120 Mg Xr12h-tab (Fexofenadine-pseudoephedrine) ....  One twice daily  Other Orders: Depo- Medrol 80mg  (J1040) Admin of Therapeutic Inj  intramuscular or subcutaneous (16109)    Medication Administration  Injection # 1:    Medication: Depo- Medrol 80mg     Diagnosis: ALLERGIC RHINITIS (ICD-477.9)    Route: IM    Site: R deltoid    Exp Date: 06/2012    Lot #: [id number]    Mfr: Pharmacia    Patient tolerated injection without complications    Given by: Duard Brady LPN (September 13, 2009 12:38 PM)  Orders Added: 1)  Depo- Medrol 80mg  [J1040] 2)  Admin of Therapeutic Inj  intramuscular or subcutaneous [60454]

## 2010-04-18 NOTE — Progress Notes (Signed)
Summary: Medication problems  Phone Note Call from Patient Call back at Work Phone [phone]   Caller: Patient Triage VM Call For: Roderick Pee MD Summary of Call: Triage VM, pt reports medicine Dr Tawanna Cooler put her on last week is causing dizziness, nausea, headache and fatigue. Pharmacy on med record Initial call taken by: Sid Falcon LPN,  March 09, 2008 12:26 PM    Merlean says she feels bad  taking the Lasix and is not urinating a lot and her legs are still swollen.  Discussed options.  Told her to hold all diuretics for two days, then alternate the HCTZ with the Lasix, and we will put in a consult request to Dr. Betti Cruz for evaluation of venous insufficiency

## 2010-04-18 NOTE — Assessment & Plan Note (Signed)
Summary: chest congestion short of breath/mhf   Vital Signs:  Patient profile:   35 year old female Height:      65 inches Weight:      167 pounds BMI:     27.89 Temp:     97.8 degrees F oral BP sitting:   108 / 78  (left arm) Cuff size:   regular  Vitals Entered By: Kern Reap CMA (Jul 26, 2008 11:08 AM)  Reason for Visit chest cold   History of Present Illness: Carol Beasley is a 35 year old female, who comes in today for a weeks history of cough.  She has history of underlying allergic rhinitis and asthma.  She's not been using inhaled medications.  She began having a cough one week ago, along with sore throat, and voice loss.  Voice loss and sore throat or gone, but the cough persists.  She has had fever, sputum production.  She does have some discomfort in the right anterior chest wall radiating to the back when she takes a deep breath or coughs.  Her vascular evaluation was negative.  She was told she had lymphatic edema and the only treatment would be full like stockings.  Her birth control husband's V   Allergies (verified): No Known Drug Allergies  Past History:  Past medical, surgical, family and social histories (including risk factors) reviewed, and no changes noted (except as noted below).  Past Medical History:    Reviewed history from 06/17/2007 and no changes required:    Asthma    childbirth x 4  Past Surgical History:    Reviewed history from 11/26/2006 and no changes required:    CB X4  Family History:    Reviewed history from 03/03/2008 and no changes required:       father in good health.  No problems       mother has hypertension       one brother two sisters, all in good health       Family History Hypertension  Social History:    Reviewed history from 06/17/2007 and no changes required:       Occupation: fireman       Married       Never Smoked       Alcohol use-no       Drug use-no       Regular exercise-yes  Review of Systems      See  HPI  Physical Exam  General:  Well-developed,well-nourished,in no acute distress; alert,appropriate and cooperative throughout examination Head:  Normocephalic and atraumatic without obvious abnormalities. No apparent alopecia or balding. Eyes:  No corneal or conjunctival inflammation noted. EOMI. Perrla. Funduscopic exam benign, without hemorrhages, exudates or papilledema. Vision grossly normal. Ears:  External ear exam shows no significant lesions or deformities.  Otoscopic examination reveals clear canals, tympanic membranes are intact bilaterally without bulging, retraction, inflammation or discharge. Hearing is grossly normal bilaterally. Nose:  External nasal examination shows no deformity or inflammation. Nasal mucosa are pink and moist without lesions or exudates. Mouth:  Oral mucosa and oropharynx without lesions or exudates.  Teeth in good repair. Neck:  No deformities, masses, or tenderness noted. Chest Wall:  No deformities, masses, or tenderness noted. Lungs:  late expiratory wheezing   Impression & Recommendations:  Problem # 1:  ASTHMA (ICD-493.90) Assessment Deteriorated  Her updated medication list for this problem includes:    Albuterol 90 Mcg/act Aers (Albuterol) .Marland Kitchen... As needed    Qvar 40 Mcg/act Aers (Beclomethasone dipropionate) .Marland KitchenMarland KitchenMarland KitchenMarland Kitchen  2 ps two times a day  Orders: Prescription Created Electronically [phone])  Complete Medication List: 1)  Albuterol 90 Mcg/act Aers (Albuterol) .... As needed 2)  Iron  .... Take one tab two every other day 3)  Qvar 40 Mcg/act Aers (Beclomethasone dipropionate) .... 2 ps two times a day 4)  Hydromet 5-1.5 Mg/18ml Syrp (Hydrocodone-homatropine) .Marland Kitchen.. 1 or 2 tsps at bedtime as needed  Patient Instructions: 1)  begin Qvar two puffs b.i.d., preceded by two puffs b.i.d. of the albuterol. 2)  Drinks 40 ounces of water daily, take one or 2 teaspoons of Hydromet nightly p.r.n. for nighttime cough.  Return p.r.n. Prescriptions: HYDROMET  5-1.5 MG/5ML SYRP (HYDROCODONE-HOMATROPINE) 1 or 2 tsps at bedtime as needed  #8oz x 1   Entered and Authorized by:   Roderick Pee MD   Signed by:   Roderick Pee MD on 07/26/2008   Method used:   Print then Give to Patient   RxID:   [id number] ALBUTEROL 90 MCG/ACT  AERS (ALBUTEROL) as needed  #1 x 3   Entered and Authorized by:   Roderick Pee MD   Signed by:   Roderick Pee MD on 07/26/2008   Method used:   Electronically to        [facility]  [address] [phone]* (retail)       [address] [place], [place]  [address]       Ph: [phone] or [phone]       Fax: [phone]   RxID:   [id number] QVAR 40 MCG/ACT AERS (BECLOMETHASONE DIPROPIONATE) 2 ps two times a day  #1 x 3   Entered and Authorized by:   Roderick Pee MD   Signed by:   Roderick Pee MD on 07/26/2008   Method used:   Electronically to        [facility]  [address] [phone]* (retail)       [address] [place], [place]  [address]       Ph: [phone] or [phone]       Fax: [phone]   RxID:   [id number]

## 2010-04-18 NOTE — Progress Notes (Signed)
Summary: Pt req status of prior auth for Zomig  Phone Note Call from Patient Call back at Work Phone [phone]   Caller: Patient Reason for Call: Acute Illness Summary of Call: Pt is req status of the prior auth for Zomig. Pt said that if prior Berkley Harvey is going to take a while, then she is not opposed to get a different med instead.  Pt is completely out of samples.  Pls call asap.  Initial call taken by: Lucy Antigua,  January 04, 2010 11:25 AM  Follow-up for Phone Call        norma initiated pa on 12-26-09. Follow-up by: Warnell Forester,  January 04, 2010 2:26 PM  Additional Follow-up for Phone Call Additional follow up Details #1::        PA has been approved  from 01/03/2010-09-28-2012 pharm tech andrea is aware. pt is aware Additional Follow-up by: Heron Sabins,  January 05, 2010 1:01 PM

## 2010-04-18 NOTE — Miscellaneous (Signed)
Summary: Orders Update   Clinical Lists Changes  Orders: Added new Referral order of Gastroenterology Referral (GI) - Signed 

## 2010-06-29 ENCOUNTER — Other Ambulatory Visit (HOSPITAL_COMMUNITY): Payer: BC Managed Care – PPO

## 2010-07-04 ENCOUNTER — Ambulatory Visit (HOSPITAL_COMMUNITY): Admission: RE | Admit: 2010-07-04 | Payer: BC Managed Care – PPO | Source: Ambulatory Visit | Admitting: General Surgery

## 2010-07-06 ENCOUNTER — Other Ambulatory Visit: Payer: Self-pay | Admitting: General Surgery

## 2010-07-06 ENCOUNTER — Ambulatory Visit (HOSPITAL_BASED_OUTPATIENT_CLINIC_OR_DEPARTMENT_OTHER)
Admission: RE | Admit: 2010-07-06 | Discharge: 2010-07-06 | Disposition: A | Discharge status: Home / Self Care | Payer: BC Managed Care – PPO | Source: Ambulatory Visit | Attending: General Surgery | Admitting: General Surgery

## 2010-07-06 DIAGNOSIS — N62 Hypertrophy of breast: Secondary | ICD-10-CM | POA: Insufficient documentation

## 2010-07-06 LAB — POCT HEMOGLOBIN-HEMACUE: Hemoglobin: 13.3 g/dL (ref 12.0–15.0)

## 2010-08-01 NOTE — Assessment & Plan Note (Signed)
OFFICE VISIT   AIJA, SCARFO E  DOB:  1975/12/01                                       03/29/2008  CHART#:[id number]   REASON FOR VISIT:  Bilateral leg swelling.   HISTORY:  This is a 35 year old female I am seeing at the request of Dr.  Tawanna Cooler for evaluation of leg swelling.  The patient states that in March  2009 she began developing bilateral lower extremity swelling from her  calf down.  This corresponded to her training for a half marathon.  Her  swelling increased as her training increased from October to December.  She has tried taking diuretics, specifically hydrochlorothiazide and  Lasix, and has not tolerated them very well and did not achieve much  symptomatic relief.  She does have some benefit with elevation.  She has  been doing mostly swimming right now and less running and, therefore,  her symptoms are somewhat improved.   REVIEW OF SYSTEMS:  GENERAL:  Negative for fevers, chills, weight loss,  weight gain.  CARDIAC:  Negative.  PULMONARY:  Negative.  GI:  Negative.  GU:  Negative.  VASCULAR:  Negative.  NEURO:  Positive for headaches.  ORTHO:  Negative.  PSYCH:  Negative.  ENT:  Negative.  HEME:  Negative.   FAMILY HISTORY:  Positive for COPD and emphysema.   PAST MEDICAL HISTORY:  Negative.   SOCIAL HISTORY:  She is married with four children.  She is a  IT sales professional.  Does not smoke.  Has never smoked.  Does not drink  alcohol.   MEDICATIONS:  Multivitamin, iron and albuterol inhaler.   ALLERGIES:  None.   PHYSICAL EXAMINATION:  Blood pressure 117/88, pulse 69, temperature  98.4.  General:  She is well-appearing, no distress, she is  normocephalic, atraumatic.  Cardiovascular:  Regular rate and rhythm.  Respirations nonlabored.  Abdomen:  Soft.  Extremities:  Reveal no  pitting edema today.  They are warm and well-perfused.  There are no  pigment changes to suggest chronic insufficiency.   DIAGNOSTIC STUDIES:  The  patient had a venous duplex today.  This  reveals no evidence of deep or superficial vein thrombosis.  There is no  evidence of reflux.   ASSESSMENT/PLAN:  Bilateral swelling.   PLAN:  The patient does not have a venous component to her swelling.  That means this is most likely related to lymphatic disease, possibly  related to chronic trauma due to her impressive running regimen.  I have  told her that we need to proceed with symptomatic control.  This would  consist of wearing compression therapy 20-30 mmHg stockings.  She also  needs to keep her legs elevated whenever possible.  I have also referred  her to a lymphedema massage therapist to see if we can help improve her  symptoms.  We discussed that this is most likely related to her running.  At this time she is not ready to give that up and so we will just need  to make sure that her symptoms do not get worse.  Again, I think the  stockings, lymphedema massage therapy and leg elevation are the only  options we have for treatment at this time.   Jorge Ny, MD  Electronically Signed   VWB/MEDQ  D:  03/29/2008  T:  03/30/2008  Job:  [id number]  cc:   Tinnie Gens A. Tawanna Cooler, MD

## 2010-08-01 NOTE — Procedures (Signed)
DUPLEX DEEP VENOUS EXAM - LOWER EXTREMITY   INDICATION:  Bilateral lower extremity swelling.   HISTORY:  Edema:  No.  Trauma/Surgery:  No.  Pain:  No.  PE:  No.  Previous DVT:  No.  Anticoagulants:  No.  Other:   DUPLEX EXAM:                CFV   SFV   PopV  PTV    GSV                R  L  R  L  R  L  R   L  R  L  Thrombosis    o  o  o  o  o  o  o   o  o  o  Spontaneous   +  +  +  +  +  +  +   +  +  +  Phasic        +  +  +  +  +  +  +   +  +  +  Augmentation  +  +  +  +  +  +  +   +  +  +  Compressible  +  +  +  +  +  +  +   +  +  +  Competent     +  +  +  +  +  +  +   +  +  +   Legend:  + - yes  o - no  p - partial  D - decreased   IMPRESSION:  No evidence of deep or superficial vein thrombosis of lower  extremities.    _____________________________  V. Charlena Cross, MD   AC/MEDQ  D:  03/29/2008  T:  03/29/2008  Job:  [id number]

## 2010-08-04 NOTE — Op Note (Signed)
[facility]  Patient:    Carol Beasley, Carol Beasley                   MRN: [id number] Proc. Date: 10/22/00 Adm. Date:  [id number] Attending:  Maxie Better                           Operative Report  PREOPERATIVE DIAGNOSIS:       Metrorrhagia.  POSTOPERATIVE DIAGNOSES:      1. Metrorrhagia.                               2. Possible submucosal fibroid.  PROCEDURES:                   1. Diagnostic hysteroscopy.                               2. Resection of a possible submucosal fibroid.                               3. Dilation and curettage.  SURGEON:                      Sheronette A. Cherly Hensen, M.D.  ANESTHESIA:                   MAC and paracervical block.  INDICATIONS:                  The patient is a 35 year old gravida 3, para 3 female on [place] with a recent ______ who, over the past several months, has had very heavy breakthrough bleeding and who now presents for further evaluation.  She had undergone a sonohystogram for possible submucosal fibroid versus polyp.  However, the lining (though thickened) did not reveal such findings.  The patient continued to have heavy bleeding.  Therefore, she now presents for further evaluation.  The risks and benefits of the procedure were explained to the patient and consent was signed.  The patient was transferred to the operating room.  DESCRIPTION OF PROCEDURE:     Under adequate monitored anesthesia, the patient was placed in the dorsal lithotomy position.  She was sterilely prepped and draped in the usual fashion.  The bladder was catheterized for a large amount of urine.  Examination under anesthesia revealed a small, anteverted uterus. No adnexal masses could be appreciated.  A bivalve speculum was placed in the vagina.  A total of 21 cc of 1% Nesacaine was injected paracervically at 3 and 9 oclock.  A single-tooth tenaculum was placed on the anterior lip of the cervix.  The cervix was then  serially dilated to a #25 Pratt dilator.  A diagnostic hysteroscope was introduced into the uterine cavity.  Both tubal ostia could be seen.  There was thickening in the left posterior wall.  No polypoid lesions were seen.  The endocervical canal was inspected and no lesions noted.  The hysteroscope was removed.  The cavity was curetted and the hysteroscope was reinserted.  This showed that the thickened area was less thickened.  No polyps were seen.  No suggestion of a fibroid definitively. The hysteroscope was remove and the cervix further dilated up to a #31 Pratt dilator.  A resectoscope was introduced.  Further  inspection of the cavity was notable for a subtle, raised area in the posterior fundal region suggestive of submucosal fibroid.  This was resected.  No other areas were noted.  The resectoscope was removed.  The cavity was curetted and all instruments were removed from the vagina.  The specimen was labeled endometrial curetting and endometrial resection with possible fibroid and was sent to pathology.  ESTIMATED BLOOD LOSS:         Minimal.  COMPLICATIONS:                None.  DISPOSITION:                  The patient tolerated the procedure well and was transferred to the recovery room in stable condition.  FLUID DEFICIT:                200 cc. DD:  10/22/00 TD:  10/23/00 Job: [id number]

## 2010-08-04 NOTE — H&P (Signed)
[facility]  Patient:    Carol Beasley, Carol Beasley                   MRN: [id number] Adm. Date:  [id number] Attending:  Silverio Lay A                         History and Physical  REASON FOR ADMISSION:         Intrauterine pregnancy at 40 weeks plus two with regular contractions.  HISTORY OF PRESENT ILLNESS:   This is a 35 year old, married, white female Gravida 3, Para 2, Abortus 0, with an estimated delivery date by ultrasound of Aug 15, 1999 being admitted for regular contractions every three minutes for the last hour which are very painful.  The patient denies any leakage of fluid or bleeding and reports good fetal activity.  She also reports having been seen the day prior by Dr. Cherly Hensen her primary care physician in the office and had a vaginal exam at that time of 34 cm.  PRENATAL COURSE:              Blood type is A positive.  RPR nonreactive. Rubella immune.  HBSAG negative.  HIV declined.  16 week AFP within normal limits.  20 week ultrasound with a normal anatomy survey and a posterior placenta.  Twenty-eight week glucose tolerance test within normal limits and 35 week group B Streptococcus positive.  Prenatal course was otherwise remarkable for first trimester spotting and also second trimester vaginal bleeding.  ALLERGIES:                    No known drug allergies.  PAST MEDICAL HISTORY:         August 1999 spontaneous vaginal delivery of a female infant at 39 weeks weighing 7 pounds and 13 ounces.  No complications. June 2000 spontaneous vaginal delivery of a female infant at 39 weeks weighing 8 pounds and 6 ounces.  No complications.  Known for migraines.  FAMILY HISTORY:               Maternal grandmother and maternal great grandmother and maternal great aunt all had ovarian cancer.  SOCIAL HISTORY:               Married.  Nonsmoker.  Works as a IT sales professional.  PHYSICAL EXAMINATION:  VITAL SIGNS:                  Normal.  HEENT:                         Negative.  LUNGS:                        Clear.  HEART:                        Normal.  ABDOMEN:                      Gravid and nontender.  PELVIC:                       Vaginal examination by nurse 6 cm bulging membranes.  EXTREMITIES:                  Negative.  NST reactive.  ASSESSMENT:  Intrauterine pregnancy at 40 weeks and 2 days with active labor.  PLAN:                         The patient is admitted to Labor and Delivery and will receive epidural.  Spontaneous vaginal delivery expected.DD:  08/17/99 TD:  08/17/99 Job: [id number]

## 2010-09-04 ENCOUNTER — Encounter (INDEPENDENT_AMBULATORY_CARE_PROVIDER_SITE_OTHER): Payer: Self-pay | Admitting: General Surgery

## 2010-09-11 ENCOUNTER — Encounter (INDEPENDENT_AMBULATORY_CARE_PROVIDER_SITE_OTHER): Payer: BC Managed Care – PPO | Admitting: General Surgery

## 2010-09-11 ENCOUNTER — Ambulatory Visit (INDEPENDENT_AMBULATORY_CARE_PROVIDER_SITE_OTHER): Payer: Self-pay | Admitting: General Surgery

## 2010-09-25 NOTE — Op Note (Signed)
  NAMEANHTHU, PERDEW            ACCOUNT NO.:  [id number]  MEDICAL RECORD NO.:  [id number]           PATIENT TYPE:  O  LOCATION:  DAY                          FACILITY:  [facility]  PHYSICIAN:  Almond Lint, MD       DATE OF BIRTH:  [birth year]  DATE OF PROCEDURE:  07/06/2010 DATE OF DISCHARGE:                              OPERATIVE REPORT   PREOPERATIVE DIAGNOSIS:  Left abnormal mammogram.  POSTOPERATIVE DIAGNOSIS:  Left abnormal mammogram.  PROCEDURE:  Left needle-localized excisional breast biopsy.  SURGEON:  Almond Lint, MD  ASSISTANT:  None.  ANESTHESIA:  General and local.  FINDINGS:  Specimen and the Faxitron image.  SPECIMEN:  Left breast mass to pathology.  ESTIMATED BLOOD LOSS:  Minimal.  COMPLICATIONS:  None known.  PROCEDURE:  Ms. Blakley was identified in the holding area and taken to the operating room, where she was placed supine on the operating room table.  General anesthesia was induced.  Her left breast was prepped and draped in sterile fashion.  Time-out was performed according to surgical safety check list.  When all was correct, we continued.  A curvilinear incision was made near her wire insertion site after administration of local anesthetic.  The skin edges were elevated with skin hooks and the area around the wire was clamped with Allis clamps.  The cautery was initially used to help create the flaps, but the breast tissue was so dense.  This was converted over to sharp dissection with curved Mayo scissors.  The specimen was at the area of firmness around the needle and was taken out as one specimen and then this was painted with the margin marker kit.  The specimen was then placed into the Faxitron machine and the image was sent.  They confirmed presence of the confirming site and the specimen and advised to mark his tissue.  The cavity was inspected for hemostasis which was achieved with the cautery. Once hemostasis was achieved, the skin  was closed with interrupted 3-0 Vicryl deep dermal and then 4-0 Monocryl running subcuticular suture.  The skin was then cleaned, dried and dressed with Steri-Strips.  The patient was awakened from anesthesia and taken to the PACU in stable condition.     Almond Lint, MD     FB/MEDQ  D:  07/06/2010  T:  07/07/2010  Job:  [id number]  Electronically Signed by Almond Lint MD on 07/10/2010 06:58:21 PM

## 2010-09-27 ENCOUNTER — Encounter: Payer: Self-pay | Admitting: Family Medicine

## 2010-09-27 ENCOUNTER — Ambulatory Visit (INDEPENDENT_AMBULATORY_CARE_PROVIDER_SITE_OTHER): Payer: BC Managed Care – PPO | Admitting: Family Medicine

## 2010-09-27 VITALS — BP 110/68 | Temp 98.8°F | Wt 158.0 lb

## 2010-09-27 DIAGNOSIS — H60399 Other infective otitis externa, unspecified ear: Secondary | ICD-10-CM

## 2010-09-27 DIAGNOSIS — H6090 Unspecified otitis externa, unspecified ear: Secondary | ICD-10-CM

## 2010-09-27 MED ORDER — CIPROFLOXACIN-HYDROCORTISONE 0.2-1 % OT SUSP: 3.0000 [drp] | Freq: Two times a day (BID) | OTIC | Status: AC

## 2010-09-27 NOTE — Progress Notes (Signed)
  Subjective:    Patient ID: Carol Beasley, female    DOB: 1975-05-29, 35 y.o.   MRN: [id number]  HPI Pain, swelling and bleeding right ear canal. Started couple days ago. No known injury. Moderate ear pain. History of frequent swimmer's ear in the past. No recent swimming activity. Denies fever. No headache. No alleviating factors. No known drug allergies.   Review of Systems  Constitutional: Negative for fever and chills.  HENT: Positive for ear pain and ear discharge. Negative for congestion and tinnitus.   Respiratory: Negative for cough.        Objective:   Physical Exam  Constitutional: She appears well-developed and well-nourished. No distress.  HENT:  Mouth/Throat: Oropharynx is clear and moist. No oropharyngeal exudate.       Left ear canal and eardrum normal. Right canal is very swollen and erythematous. No significant debris. Eardrum not fully visualized secondary to canal edema  Neck: Neck supple.  Pulmonary/Chest: Effort normal and breath sounds normal. No respiratory distress. She has no wheezes. She has no rales.  Lymphadenopathy:    She has no cervical adenopathy.          Assessment & Plan:  Right otitis externa. Keep ear dry. Cipro HC 3 drops right ear twice daily. Wick applied secondary to degree of canal swelling. Remove in 2 days. Followup with primary in 2 days if no improvement

## 2010-09-27 NOTE — Patient Instructions (Signed)
Swimmer's Ear (Otitis Externa) Otitis externa ("swimmer's ear") is a germ (bacterial) or fungal infection of the outer ear canal (from the eardrum to the outside of the ear). Swimming in dirty water may cause swimmer's ear. It also may be caused by moisture in the ear from water remaining after swimming or bathing. Often the first signs of infection may be itching in the ear canal. This may progress to ear canal swelling, redness, and pus drainage which may be signs of infection. HOME CARE INSTRUCTIONS  Apply the antibiotic drops to the ear canal as prescribed by your doctor.   This can be a very painful medical condition. A strong pain reliever may be prescribed.   Only take over-the-counter or prescription medicines for pain, discomfort, or fever as directed by your caregiver.   If your caregiver has given you a follow-up appointment, it is very important to keep that appointment. Not keeping the appointment could result in a chronic or permanent injury, pain, hearing loss and disability. If there is any problem keeping the appointment, you must call back to this facility for assistance.  PREVENTION  It is important to keep your ear dry. Use the corner of a towel to wick water out of the ear canal after swimming or bathing.   Avoid scratching in your ear. This can damage the ear canal or remove the protective wax lining the canal and make it easier for germs (bacteria) or a fungus to grow.   You may use ear drops made of rubbing alcohol and vinegar after swimming to prevent future "swimmer ear" infections. Make up a small bottle of equal parts white vinegar and alcohol. Put 3 or 4 drops into each ear after swimming.   Avoid swimming in lakes, polluted water, or poorly chlorinated pools.  SEEK MEDICAL CARE IF:  An oral temperature above 101 develops.   Your ear is still painful after 3 days and shows signs of getting worse (redness, swelling, pain, or pus).  MAKE SURE YOU:   Understand  these instructions.   Will watch your condition.   Will get help right away if you are not doing well or get worse.  Document Released: 03/05/2005 Document Re-Released: 02/16/2008 Exit[employer] Patient Information 2011 [employer]. 

## 2010-09-29 ENCOUNTER — Encounter: Payer: Self-pay | Admitting: Family Medicine

## 2010-09-29 ENCOUNTER — Ambulatory Visit (INDEPENDENT_AMBULATORY_CARE_PROVIDER_SITE_OTHER): Payer: BC Managed Care – PPO | Admitting: Family Medicine

## 2010-09-29 VITALS — BP 118/80 | Temp 98.3°F | Wt 161.0 lb

## 2010-09-29 DIAGNOSIS — H60339 Swimmer's ear, unspecified ear: Secondary | ICD-10-CM

## 2010-09-29 DIAGNOSIS — H60331 Swimmer's ear, right ear: Secondary | ICD-10-CM | POA: Insufficient documentation

## 2010-09-29 MED ORDER — CEPHALEXIN 500 MG PO CAPS: ORAL_CAPSULE | ORAL | Status: AC

## 2010-09-29 MED ORDER — HYDROCODONE-ACETAMINOPHEN 7.5-750 MG PO TABS: ORAL_TABLET | ORAL | Status: DC

## 2010-09-29 NOTE — Patient Instructions (Signed)
Two drops of the Cipro otic in your right ear canal nightly for 10 days.  Keflex two tabs twice daily.  Vicodin one half to one tablet at bedtime as needed for severe pain.  Return p.r.n.

## 2010-09-29 NOTE — Progress Notes (Signed)
  Subjective:    Patient ID: Carol Beasley, female    DOB: 09/01/1975, 35 y.o.   MRN: [id number]  HPI Carol Beasley is a [age]-year-old, married female, nonsmoker, IT sales professional by trade, who comes in today for evaluation of pain in her right ear.  She was here on Wednesday, and was diagnosed with otitis externa.  The swelling was so bad that Dr. Caryl Never put a wick in her right ear canal.  She would like the wick removed.  She's also developed some soreness and redness around the outside of her ear.   Review of Systems    General an ENT review of systems otherwise negative Objective:   Physical Exam    Well-developed and nourished, female no acute distress.  Examination right ear shows a wick that was removed.  The ear canal is still swollen.  Red and inflamed.  There is also some surrounding erythema of the ear consistent with A. Early cellulitis    Assessment & Plan:  Otitis externa number two cellulitis, secondary plan continue eardrops added antibiotics

## 2010-10-04 ENCOUNTER — Telehealth: Payer: Self-pay

## 2010-10-04 NOTE — Telephone Encounter (Signed)
Pt questioning if abx is working for her ear inf. Pt has only been on abx Rx'ed by Dr. Tawanna Cooler for 4-5 days. She notes that the swelling has decreases tremendously but ear is still popping and has a little pain. Advised pt to give abx more time to take full effect and call back if she doesn't notice a significant change with 10-14 days of abx. Pt verbalized understanding

## 2010-12-26 ENCOUNTER — Encounter: Payer: Self-pay | Admitting: Family Medicine

## 2010-12-26 ENCOUNTER — Ambulatory Visit (INDEPENDENT_AMBULATORY_CARE_PROVIDER_SITE_OTHER): Payer: BC Managed Care – PPO | Admitting: Family Medicine

## 2010-12-26 DIAGNOSIS — H60339 Swimmer's ear, unspecified ear: Secondary | ICD-10-CM

## 2010-12-26 DIAGNOSIS — H60331 Swimmer's ear, right ear: Secondary | ICD-10-CM

## 2010-12-26 DIAGNOSIS — G43109 Migraine with aura, not intractable, without status migrainosus: Secondary | ICD-10-CM

## 2010-12-26 MED ORDER — PREDNISONE 20 MG PO TABS: ORAL_TABLET | ORAL | Status: DC

## 2010-12-26 MED ORDER — ONDANSETRON HCL 4 MG PO TABS: 4.0000 mg | ORAL_TABLET | Freq: Two times a day (BID) | ORAL | Status: DC | PRN

## 2010-12-26 MED ORDER — HYDROCODONE-ACETAMINOPHEN 7.5-750 MG PO TABS: ORAL_TABLET | ORAL | Status: DC

## 2010-12-26 MED ORDER — ZOLMITRIPTAN 2.5 MG PO TBDP: 2.5000 mg | ORAL_TABLET | ORAL | Status: DC | PRN

## 2010-12-26 NOTE — Progress Notes (Signed)
  Subjective:    Patient ID: ILO BEAMON, female    DOB: 1975-06-05, 35 y.o.   MRN: [id number]  HPI  Carol Beasley is a 35 year old, married female, nonsmoker, who comes in today because of a cluster migraine.  She typically has intermittent migraine headaches.  However, this time, the headache is then present for a week.  She has mild nausea no vomiting.  She does  have photophobia.  Review of Systems    General and neurologic review of systems otherwise negative Objective:   Physical Exam Well-developed well-nourished, female in no acute distress.  The lights are turned out because of photophobia       Assessment & Plan:  Cluster daily migraines.  Plan prednisone burst and taper,,,,,,,,,,,, Vicodin and Zofran p.r.n.  Out of work until Friday

## 2010-12-26 NOTE — Patient Instructions (Signed)
Begin the prednisone as directed.  Zofran one to 3 times daily as needed for nausea.  Vicodin one half to one tab q.4 h. P.r.n. For pain.  Out of work until Friday.  Return p.r.n.

## 2011-01-18 ENCOUNTER — Other Ambulatory Visit: Payer: Self-pay | Admitting: Family Medicine

## 2011-03-27 ENCOUNTER — Telehealth: Payer: Self-pay | Admitting: Family Medicine

## 2011-03-27 NOTE — Telephone Encounter (Signed)
Pt decline ov requesting dr todd to call her concerning migraines. Pt has taken medication. What the next step?

## 2011-03-28 NOTE — Telephone Encounter (Signed)
Patient states she is feeling better today.  If she is not completely better tomorrow she will come in for an office visit

## 2011-03-30 ENCOUNTER — Other Ambulatory Visit: Payer: BC Managed Care – PPO

## 2011-03-30 ENCOUNTER — Other Ambulatory Visit: Payer: Self-pay | Admitting: Orthopedic Surgery

## 2011-03-30 DIAGNOSIS — T148XXA Other injury of unspecified body region, initial encounter: Secondary | ICD-10-CM

## 2011-07-13 ENCOUNTER — Telehealth: Payer: Self-pay | Admitting: *Deleted

## 2011-07-13 NOTE — Telephone Encounter (Signed)
Patient is calling because she is having a lot of anxiety because she and her husband are going through a separation.  She would like a prescription called in if possible.  She is unable to come in for an office visit because she has classes all day.  [facility]

## 2011-07-16 MED ORDER — LORAZEPAM 0.5 MG PO TABS: 0.5000 mg | ORAL_TABLET | Freq: Every evening | ORAL | Status: AC | PRN

## 2011-07-16 NOTE — Telephone Encounter (Signed)
Ativan 0.5 dispense 30 tabs directions 1 each bedtime when necessary refills x1 if symptoms persist beyond 2 months office visit

## 2011-09-28 ENCOUNTER — Other Ambulatory Visit: Payer: Self-pay | Admitting: *Deleted

## 2011-09-28 MED ORDER — LORAZEPAM 0.5 MG PO TABS: 0.5000 mg | ORAL_TABLET | Freq: Every day | ORAL | Status: AC

## 2011-11-29 ENCOUNTER — Ambulatory Visit (INDEPENDENT_AMBULATORY_CARE_PROVIDER_SITE_OTHER): Payer: BC Managed Care – PPO | Admitting: Family Medicine

## 2011-11-29 ENCOUNTER — Encounter: Payer: Self-pay | Admitting: Family Medicine

## 2011-11-29 VITALS — BP 120/80 | Temp 98.3°F | Wt 136.0 lb

## 2011-11-29 DIAGNOSIS — G43109 Migraine with aura, not intractable, without status migrainosus: Secondary | ICD-10-CM

## 2011-11-29 DIAGNOSIS — F341 Dysthymic disorder: Secondary | ICD-10-CM

## 2011-11-29 DIAGNOSIS — F418 Other specified anxiety disorders: Secondary | ICD-10-CM

## 2011-11-29 MED ORDER — CITALOPRAM HYDROBROMIDE 10 MG PO TABS: 10.0000 mg | ORAL_TABLET | Freq: Every day | ORAL | Status: DC

## 2011-11-29 MED ORDER — PREDNISONE 20 MG PO TABS: ORAL_TABLET | ORAL | Status: DC

## 2011-11-29 NOTE — Patient Instructions (Signed)
Continue the Ativan 0.5 mg in the morning when necessary and begin the Celexa 10 mg at bedtime return when necessary

## 2011-11-29 NOTE — Progress Notes (Signed)
  Subjective:    Patient ID: Carol Beasley, female    DOB: October 19, 1975, 36 y.o.   MRN: [id number]  HPI Carol Beasley is a 36 year old female currently separated from her husband. They're going to individual marriage counseling and are going to begin group counseling. She takes Ativan 0.5 each  in the morning and would like something to take at bedtime for sleep.   Review of Systems    general and psychiatric review of systems negative no suicidal ideation Objective:   Physical Exam Well-developed and nourished female no acute distress she is oriented x3 she is appropriate seems happy mostly probable anxiety with depression mild       Assessment & Plan:

## 2011-12-06 ENCOUNTER — Telehealth: Payer: Self-pay | Admitting: Family Medicine

## 2011-12-06 NOTE — Telephone Encounter (Addendum)
Pt needs work note with  specify dates 9-11,9-14 and 72-16. Pt was seen on 11-29-11 for migraine headache. Pt was able to return to work on 9-18 with no restrictions. Please call pt when ready for pick up

## 2011-12-06 NOTE — Telephone Encounter (Signed)
ok 

## 2011-12-06 NOTE — Telephone Encounter (Signed)
Letter ready for pick up.  Tried to call patient but number has been disconnected.

## 2012-01-07 ENCOUNTER — Other Ambulatory Visit: Payer: Self-pay | Admitting: Family Medicine

## 2012-04-10 ENCOUNTER — Encounter: Payer: Self-pay | Admitting: Family Medicine

## 2012-04-10 ENCOUNTER — Other Ambulatory Visit: Payer: Self-pay | Admitting: Family Medicine

## 2012-04-10 ENCOUNTER — Ambulatory Visit (INDEPENDENT_AMBULATORY_CARE_PROVIDER_SITE_OTHER): Payer: BC Managed Care – PPO | Admitting: Family Medicine

## 2012-04-10 VITALS — BP 110/70 | Temp 97.8°F

## 2012-04-10 DIAGNOSIS — J019 Acute sinusitis, unspecified: Secondary | ICD-10-CM

## 2012-04-10 MED ORDER — AMOXICILLIN 400 MG/5ML PO SUSR: ORAL | Status: DC

## 2012-04-10 NOTE — Progress Notes (Signed)
  Subjective:    Patient ID: Carol Beasley, female    DOB: [birth year], 37 y.o.   MRN: [id number]  HPI Ten-day history of progressive sinus congestion Maxillary and frontal sinus pressure. Very little drainage. Intermittent headaches and increased fatigue. Associated dry cough for the past several days. Initially had sore throat which is improved at this time. Denies any voice changes. Has tried over-the-counter medications without much improvement including decongestants Nonsmoker no chronic medical problems No fever   Review of Systems As per history of present illness    Objective:   Physical Exam  Constitutional: She appears well-developed and well-nourished.  HENT:  Right Ear: External ear normal.  Left Ear: External ear normal.  Nose: Nose normal.  Mouth/Throat: Oropharynx is clear and moist.  Neck: Neck supple.  Cardiovascular: Normal rate and regular rhythm.   Pulmonary/Chest: Effort normal and breath sounds normal. No respiratory distress. She has no wheezes. She has no rales.  Lymphadenopathy:    She has no cervical adenopathy.          Assessment & Plan:  Acute sinusitis. Given duration of symptoms start amoxicillin 800 mg twice a day for 10 days.

## 2012-04-10 NOTE — Patient Instructions (Addendum)

## 2012-07-21 IMAGING — NM NM HEPATO W/GB/PHARM/[PERSON_NAME]
2 series · 12 of 12 positions shown · non-contrast
Comparison: Abdominal ultrasound 12/27/2009.

CLINICAL DATA: 34-year-old female with right upper quadrant pain
and nausea.

NUCLEAR MEDICINE HEPATOBILIARY IMAGING WITH GALLBLADDER EF
TECHNIQUE: Sequential images of the abdomen were obtained [DATE] minutes following intravenous administration of
radiopharmaceutical.  After slow intravenous infusion of
micrograms Cholecystokinin, gallbladder ejection fraction was
determined.
Radiopharmaceutical:  5.3 mCi 8c-66m Choletec

[he hepatobiliary · 3.43mm/px · 6 of 30 frames shown (1 of 2)]
[frame 3/30]
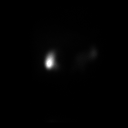
[frame 8/30]
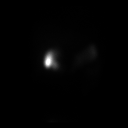
[frame 13/30]
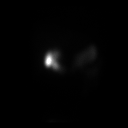
[frame 18/30]
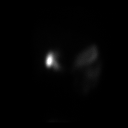
[frame 23/30]
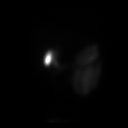
[frame 28/30]
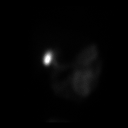

[he hepatobiliary · 3.43mm/px · 6 of 45 frames shown (2 of 2)]
[frame 4/45]
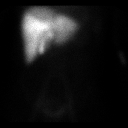
[frame 12/45]
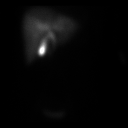
[frame 19/45]
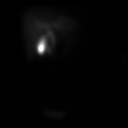
[frame 27/45]
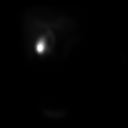
[frame 34/45]
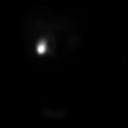
[frame 42/45]
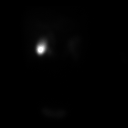

[12 of 12 positions shown; findings below may reference images not displayed]

FINDINGS: Normal radiotracer concentration in the liver and
clearance of the blood pool.  Prompt appearance of the gallbladder
and central biliary tree by 10 minutes.  Prompt small bowel
appearance thereafter.  Gallbladder ejection fraction is measured
to be 47.5% at 30 minutes.  Normal gallbladder ejection fraction is
considered to be greater than 35% at 30 minutes (Ziessman et al.,
4551 J. Nuclear Med).

The patient did not experience symptoms during CCK infusion.
IMPRESSION: Normal study with normal gallbladder EF as above.

## 2012-11-10 ENCOUNTER — Other Ambulatory Visit: Payer: Self-pay | Admitting: Family Medicine

## 2012-12-12 ENCOUNTER — Other Ambulatory Visit: Payer: Self-pay | Admitting: Family Medicine

## 2013-02-02 ENCOUNTER — Other Ambulatory Visit: Payer: Self-pay | Admitting: *Deleted

## 2013-02-02 DIAGNOSIS — G43109 Migraine with aura, not intractable, without status migrainosus: Secondary | ICD-10-CM

## 2013-02-02 MED ORDER — ZOLMITRIPTAN 2.5 MG PO TBDP: ORAL_TABLET | ORAL | Status: DC

## 2013-02-02 MED ORDER — HYDROCODONE-ACETAMINOPHEN 7.5-750 MG PO TABS: ORAL_TABLET | ORAL | Status: DC

## 2013-02-09 DIAGNOSIS — M47812 Spondylosis without myelopathy or radiculopathy, cervical region: Secondary | ICD-10-CM | POA: Insufficient documentation

## 2014-05-11 ENCOUNTER — Other Ambulatory Visit: Payer: Self-pay | Admitting: *Deleted

## 2014-05-11 MED ORDER — LISDEXAMFETAMINE DIMESYLATE 10 MG PO CAPS: 1.0000 | ORAL_CAPSULE | Freq: Every day | ORAL | Status: DC

## 2014-06-22 ENCOUNTER — Other Ambulatory Visit: Payer: Self-pay | Admitting: *Deleted

## 2014-06-22 MED ORDER — ZOLMITRIPTAN 2.5 MG PO TBDP: ORAL_TABLET | ORAL | Status: DC

## 2014-07-08 ENCOUNTER — Encounter: Payer: Self-pay | Admitting: Family Medicine

## 2014-08-05 ENCOUNTER — Encounter: Payer: Self-pay | Admitting: Family Medicine

## 2014-08-05 ENCOUNTER — Ambulatory Visit (INDEPENDENT_AMBULATORY_CARE_PROVIDER_SITE_OTHER): Payer: BLUE CROSS/BLUE SHIELD | Admitting: Family Medicine

## 2014-08-05 VITALS — BP 130/90 | Temp 98.6°F | Wt 160.0 lb

## 2014-08-05 DIAGNOSIS — F909 Attention-deficit hyperactivity disorder, unspecified type: Secondary | ICD-10-CM | POA: Diagnosis not present

## 2014-08-05 DIAGNOSIS — G43109 Migraine with aura, not intractable, without status migrainosus: Secondary | ICD-10-CM

## 2014-08-05 DIAGNOSIS — L719 Rosacea, unspecified: Secondary | ICD-10-CM | POA: Insufficient documentation

## 2014-08-05 DIAGNOSIS — F988 Other specified behavioral and emotional disorders with onset usually occurring in childhood and adolescence: Secondary | ICD-10-CM

## 2014-08-05 MED ORDER — LISDEXAMFETAMINE DIMESYLATE 10 MG PO CAPS: 10.0000 mg | ORAL_CAPSULE | Freq: Two times a day (BID) | ORAL | Status: DC

## 2014-08-05 MED ORDER — METRONIDAZOLE 1 % EX GEL
Freq: Every day | CUTANEOUS | Status: DC
Start: 2014-08-05 — End: 2015-11-30

## 2014-08-05 MED ORDER — HYDROCODONE-ACETAMINOPHEN 7.5-750 MG PO TABS: ORAL_TABLET | ORAL | Status: DC

## 2014-08-05 MED ORDER — DOXYCYCLINE HYCLATE 100 MG PO TABS: ORAL_TABLET | ORAL | Status: DC

## 2014-08-05 MED ORDER — ZOLMITRIPTAN 2.5 MG PO TBDP: ORAL_TABLET | ORAL | Status: DC

## 2014-08-05 MED ORDER — LISDEXAMFETAMINE DIMESYLATE 10 MG PO CAPS: 1.0000 | ORAL_CAPSULE | Freq: Every day | ORAL | Status: DC

## 2014-08-05 NOTE — Patient Instructions (Signed)
Vyvanse 10 mg,,,,,, one twice daily when necessary  Call 2 weeks from being out of your medicine,,,,,, [facility] voicemail is [phone]  Doxycycline and MetroGel,,,,,,,,, at bedtime for rosacea  Imitrex stat,,,,,, for migraine headache  Return when necessary,

## 2014-08-05 NOTE — Progress Notes (Signed)
   Subjective:    Patient ID: [name], female    DOB: December 31, 1975, 39 y.o.   MRN: [id number]  HPI Carol Beasley is a 39 year old married female G3 P3 10 EMS and [place] who comes in today for evaluation of 3 problems  She has a history of adult ADD and is on Vyvanse 10 mg daily however she says it wears off around 2:00. She would like to try to take one twice daily  She takes Zomig when necessary for migraine headaches it seems to work well. She really has to take a Vicodin elicits severe  A new problem is rosacea. She does not drink any alcohol. Stress changed trigger it. She would like to discuss some treatment options.  She had a physical exam work at all was normal. She gets routine eye care, dental care, does not do BSE monthly, recommended at age 74 she begin regular mammography. Pelvic and Pap by GYN. Tetanus booster 2009   Review of Systems    negative Objective:   Physical Exam  Well-developed well-nourished female no acute distress vital signs stable she's afebrile      Assessment & Plan:  Adult ADD,,,,,,,, increase Vyvanse to 10 mg twice a day  Migraine headaches,,,,, Zomig when necessary  Rosacea,,,,,,,,,,, trial of doxycycline and MetroGel

## 2014-08-05 NOTE — Progress Notes (Signed)
Pre visit review using our clinic review tool, if applicable. No additional management support is needed unless otherwise documented below in the visit note. 

## 2014-08-09 ENCOUNTER — Telehealth: Payer: Self-pay | Admitting: Family Medicine

## 2014-08-09 MED ORDER — LISDEXAMFETAMINE DIMESYLATE 20 MG PO CAPS: 20.0000 mg | ORAL_CAPSULE | Freq: Every day | ORAL | Status: DC

## 2014-08-09 NOTE — Telephone Encounter (Signed)
Left message on machine for patient Rx ready for pick up and she should bring back any extra prescriptions to shred.

## 2014-08-09 NOTE — Telephone Encounter (Signed)
Patient states her insurance will not cover Vyvanse 10 mg, 60 per 30.  She states she needs a new RX for 20 mg, 30 per 30.  She said she is completely out.

## 2014-08-10 ENCOUNTER — Other Ambulatory Visit: Payer: Self-pay | Admitting: *Deleted

## 2014-08-10 MED ORDER — HYDROCODONE-ACETAMINOPHEN 7.5-500 MG PO TABS: ORAL_TABLET | ORAL | Status: DC

## 2014-08-10 NOTE — Telephone Encounter (Signed)
Rx ready for pick up and patient is aware 

## 2014-10-22 ENCOUNTER — Telehealth: Payer: Self-pay | Admitting: Family Medicine

## 2014-10-22 NOTE — Telephone Encounter (Signed)
Okay to fill 11/09/14

## 2014-10-22 NOTE — Telephone Encounter (Signed)
Pt needs new rx  vyvanse 20 mg

## 2014-11-09 MED ORDER — LISDEXAMFETAMINE DIMESYLATE 20 MG PO CAPS
20.0000 mg | ORAL_CAPSULE | Freq: Every day | ORAL | Status: DC
Start: 2014-11-09 — End: 2015-03-24

## 2014-11-09 MED ORDER — LISDEXAMFETAMINE DIMESYLATE 20 MG PO CAPS: 20.0000 mg | ORAL_CAPSULE | Freq: Every day | ORAL | Status: DC

## 2014-11-09 NOTE — Telephone Encounter (Signed)
Rx ready for pick up and patient is aware 

## 2014-12-20 ENCOUNTER — Ambulatory Visit (INDEPENDENT_AMBULATORY_CARE_PROVIDER_SITE_OTHER): Payer: BLUE CROSS/BLUE SHIELD | Admitting: *Deleted

## 2014-12-20 DIAGNOSIS — Z23 Encounter for immunization: Secondary | ICD-10-CM | POA: Diagnosis not present

## 2015-03-23 ENCOUNTER — Telehealth: Payer: Self-pay | Admitting: Family Medicine

## 2015-03-23 NOTE — Telephone Encounter (Signed)
Pt's spouse called to get a refill on Vyvanse. Please advise

## 2015-03-24 MED ORDER — LISDEXAMFETAMINE DIMESYLATE 20 MG PO CAPS: 20.0000 mg | ORAL_CAPSULE | Freq: Every day | ORAL | Status: DC

## 2015-03-24 NOTE — Telephone Encounter (Signed)
rx ready for pick up and patient is aware  

## 2015-06-15 ENCOUNTER — Telehealth: Payer: Self-pay | Admitting: Family Medicine

## 2015-06-15 MED ORDER — LISDEXAMFETAMINE DIMESYLATE 20 MG PO CAPS: 20.0000 mg | ORAL_CAPSULE | Freq: Every day | ORAL | Status: DC

## 2015-06-15 NOTE — Telephone Encounter (Signed)
Pt request refill  lisdexamfetamine (VYVANSE) 20 MG capsule 3 mo supply

## 2015-06-15 NOTE — Telephone Encounter (Signed)
rx ready for pick up and patient is aware  

## 2015-10-25 ENCOUNTER — Telehealth: Payer: Self-pay | Admitting: Family Medicine

## 2015-10-25 NOTE — Telephone Encounter (Signed)
Patient wanting to switch to [facility].  Please advise.

## 2015-10-25 NOTE — Telephone Encounter (Signed)
Yes, OK 

## 2015-10-26 NOTE — Telephone Encounter (Signed)
Patient scheduled 11/30/15.

## 2015-11-30 ENCOUNTER — Ambulatory Visit (INDEPENDENT_AMBULATORY_CARE_PROVIDER_SITE_OTHER): Payer: BLUE CROSS/BLUE SHIELD | Admitting: Family Medicine

## 2015-11-30 ENCOUNTER — Encounter: Payer: Self-pay | Admitting: Family Medicine

## 2015-11-30 VITALS — BP 134/84 | HR 75 | Temp 98.1°F | Resp 16 | Ht 65.0 in | Wt 158.8 lb

## 2015-11-30 DIAGNOSIS — Z862 Personal history of diseases of the blood and blood-forming organs and certain disorders involving the immune mechanism: Secondary | ICD-10-CM | POA: Diagnosis not present

## 2015-11-30 DIAGNOSIS — F900 Attention-deficit hyperactivity disorder, predominantly inattentive type: Secondary | ICD-10-CM | POA: Diagnosis not present

## 2015-11-30 DIAGNOSIS — G43109 Migraine with aura, not intractable, without status migrainosus: Secondary | ICD-10-CM

## 2015-11-30 DIAGNOSIS — G43909 Migraine, unspecified, not intractable, without status migrainosus: Secondary | ICD-10-CM

## 2015-11-30 DIAGNOSIS — F909 Attention-deficit hyperactivity disorder, unspecified type: Secondary | ICD-10-CM

## 2015-11-30 MED ORDER — LISDEXAMFETAMINE DIMESYLATE 20 MG PO CAPS: 20.0000 mg | ORAL_CAPSULE | Freq: Every day | ORAL | 0 refills | Status: DC

## 2015-11-30 MED ORDER — ZOLMITRIPTAN 2.5 MG PO TBDP: ORAL_TABLET | ORAL | 10 refills | Status: DC

## 2015-11-30 MED ORDER — HYDROCODONE-ACETAMINOPHEN 7.5-500 MG PO TABS: ORAL_TABLET | ORAL | 0 refills | Status: DC

## 2015-11-30 NOTE — Progress Notes (Signed)
Office Note 11/30/2015  CC:  Chief Complaint  Patient presents with  . Establish Care    HPI:  Carol Beasley is a 40 y.o. White female who is here to establish care. Most recent PCP: Dr. Sherren Mocha at [facility] (retiring). GYN: Dr. Garwin Brothers. Most recent labs via her employer were done this summer.  She'll get a copy and send the results here for my review.  Takes vyvanse daily.  This helps sufficiently with concentration/focus.  No adverse side effects. She is due for RF today.  Has migraine HAs, zomig usually helps.  When zomig not helpful she has to occ take vicodin pain pill.  No acute complaints.  Past Medical History:  Diagnosis Date  . Adult ADHD   . Allergy   . Anemia    History of : ? No iron testing done?  Oral iron no help for Hb per pt.  Vit B12 helps (oral/otc) per pt.  . Anxiety and depression    when going through separation was on citalopram temporarily.  . Endometriosis   . Migraine syndrome     Past Surgical History:  Procedure Laterality Date  . BREAST LUMPECTOMY W/ NEEDLE LOCALIZATION    . TONSILLECTOMY      Family History  Problem Relation Age of Onset  . Cancer Mother     Ovarian  . Hypertension Mother   . Irritable bowel syndrome Mother   . Crohn's disease Mother   . COPD Father   . Hypertension Sister   . Hypertension Brother     Social History   Social History  . Marital status: Married    Spouse name: N/A  . Number of children: N/A  . Years of education: N/A   Occupational History  . Not on file.   Social History Main Topics  . Smoking status: Never Smoker  . Smokeless tobacco: Never Used  . Alcohol use No  . Drug use: No  . Sexual activity: Yes     Comment: husband - vasectomy   Other Topics Concern  . Not on file   Social History Narrative   Married, 3 sons, 1 daughter.   Educ: Masters   Occup: Firefighter in [place]   No T/A/Ds.   Exercise: couple days a week (biking, other).       Outpatient  Medications Prior to Visit  Medication Sig Dispense Refill  . PROAIR HFA 108 (90 BASE) MCG/ACT inhaler USE AS NEEDED 3 Inhaler 2  . vitamin B-12 (CYANOCOBALAMIN) 1000 MCG tablet Take 1,000 mcg by mouth daily.      Marland Kitchen lisdexamfetamine (VYVANSE) 20 MG capsule Take 1 capsule (20 mg total) by mouth daily. 30 capsule 0  . lisdexamfetamine (VYVANSE) 20 MG capsule Take 1 capsule (20 mg total) by mouth daily. 30 capsule 0  . lisdexamfetamine (VYVANSE) 20 MG capsule Take 1 capsule (20 mg total) by mouth daily. 30 capsule 0  . zolmitriptan (ZOMIG ZMT) 2.5 MG disintegrating tablet TAKE 1 TABLET (2.5 MG TOTAL) BY MOUTH AS NEEDED FOR MIGRAINE. 10 tablet 10  . citalopram (CELEXA) 10 MG tablet TAKE 1 TABLET (10 MG TOTAL) BY MOUTH DAILY. (Patient not taking: Reported on 11/30/2015) 100 tablet 2  . doxycycline (VIBRA-TABS) 100 MG tablet One by mouth daily at bedtime for rosacea 100 tablet 3  . HYDROcodone-acetaminophen (LORTAB) 7.5-500 MG per tablet Take one half to one tab every 4 hours as needed for severe headache (Patient not taking: Reported on 11/30/2015) 30 tablet 0  . metroNIDAZOLE (METROGEL)  1 % gel Apply topically daily. Apply daily at bedtime for rosacea (Patient not taking: Reported on 11/30/2015) 60 g 6   No facility-administered medications prior to visit.     No Known Allergies  ROS Review of Systems  Constitutional: Negative for fatigue and fever.  HENT: Negative for congestion and sore throat.   Eyes: Negative for visual disturbance.  Respiratory: Negative for cough.   Cardiovascular: Negative for chest pain.  Gastrointestinal: Negative for abdominal pain and nausea.  Genitourinary: Negative for dysuria.  Musculoskeletal: Negative for back pain and joint swelling.  Skin: Negative for rash.  Neurological: Negative for weakness and headaches.  Hematological: Negative for adenopathy.    PE; Blood pressure 134/84, pulse 75, temperature 98.1 F (36.7 C), temperature source Oral, resp. rate  16, height 5\' 5"  (1.651 m), weight 158 lb 12.8 oz (72 kg), last menstrual period 11/16/2015, SpO2 98 %.  Wt Readings from Last 2 Encounters:  11/30/15 158 lb 12.8 oz (72 kg)  08/05/14 160 lb (72.6 kg)    Gen: alert, oriented x 4, affect pleasant.  Lucid thinking and conversation noted. HEENT: PERRLA, EOMI.   Neck: no LAD, mass, or thyromegaly. CV: RRR, no m/r/g LUNGS: CTA bilat, nonlabored. NEURO: no tremor or tics noted on observation.  Coordination intact. CN 2-12 grossly intact bilaterally, strength 5/5 in all extremeties.  No ataxia.  Pertinent labs:   Lab Results  Component Value Date   WBC 7.3 12/26/2009   HGB 13.3 07/06/2010   HCT 40.2 12/26/2009   MCV 92.8 12/26/2009   PLT 234.0 12/26/2009   Lab Results  Component Value Date   CREATININE 0.7 12/26/2009   BUN 13 12/26/2009   NA 140 12/26/2009   K 3.7 12/26/2009   CL 104 12/26/2009   CO2 29 12/26/2009   Lab Results  Component Value Date   ALT 20 12/26/2009   AST 20 12/26/2009   ALKPHOS 52 12/26/2009   BILITOT 1.1 12/26/2009    ASSESSMENT AND PLAN:   Transfer pt;  1) Adult ADD: The current medical regimen is effective;  continue present plan and medications. I printed rx's for vyvanse 20 mg, 1 po qd, #30  today for this month, Oct 2017, and Nov 2017.  Appropriate fill on/after date was noted on each rx.  2) Migraine HA's: stable.  Rf'd zomig and vicodin (which she uses very sparingly, #30 rx'd today).  3) Hx of anemia: convoluted hx from patient.  Unclear testing in the past regarding iron. Vit B12 never checked per pt but she states her Hb stays low normal on this med and she feels better on this med (oral/otc supplement). She had most recent labs via her employer [employer]nsic [employer] in [place] a few months ago and she says she will have these sent to me. She denies excessive vag bleeding/DUB.  An After Visit Summary was printed and given to the patient.  FOLLOW UP:  Return in about 6 months (around 05/29/2016) for  f/u ADD.  Signed:  Crissie Sickles, MD           11/30/2015

## 2015-11-30 NOTE — Progress Notes (Signed)
Pre visit review using our clinic review tool, if applicable. No additional management support is needed unless otherwise documented below in the visit note. 

## 2015-12-02 DIAGNOSIS — Z01419 Encounter for gynecological examination (general) (routine) without abnormal findings: Secondary | ICD-10-CM | POA: Diagnosis not present

## 2015-12-02 DIAGNOSIS — Z130 Encounter for screening for diseases of the blood and blood-forming organs and certain disorders involving the immune mechanism: Secondary | ICD-10-CM | POA: Diagnosis not present

## 2015-12-02 DIAGNOSIS — Z6826 Body mass index (BMI) 26.0-26.9, adult: Secondary | ICD-10-CM | POA: Diagnosis not present

## 2015-12-02 DIAGNOSIS — Z1151 Encounter for screening for human papillomavirus (HPV): Secondary | ICD-10-CM | POA: Diagnosis not present

## 2015-12-02 DIAGNOSIS — Z0000 Encounter for general adult medical examination without abnormal findings: Secondary | ICD-10-CM | POA: Diagnosis not present

## 2015-12-02 DIAGNOSIS — Z131 Encounter for screening for diabetes mellitus: Secondary | ICD-10-CM | POA: Diagnosis not present

## 2015-12-02 DIAGNOSIS — Z1329 Encounter for screening for other suspected endocrine disorder: Secondary | ICD-10-CM | POA: Diagnosis not present

## 2016-03-17 DIAGNOSIS — J209 Acute bronchitis, unspecified: Secondary | ICD-10-CM | POA: Diagnosis not present

## 2016-06-13 ENCOUNTER — Ambulatory Visit (INDEPENDENT_AMBULATORY_CARE_PROVIDER_SITE_OTHER): Payer: BLUE CROSS/BLUE SHIELD | Admitting: Family Medicine

## 2016-06-13 ENCOUNTER — Encounter: Payer: Self-pay | Admitting: Family Medicine

## 2016-06-13 VITALS — BP 120/81 | HR 80 | Temp 98.4°F | Resp 16 | Ht 65.0 in | Wt 162.2 lb

## 2016-06-13 DIAGNOSIS — F909 Attention-deficit hyperactivity disorder, unspecified type: Secondary | ICD-10-CM

## 2016-06-13 DIAGNOSIS — G43909 Migraine, unspecified, not intractable, without status migrainosus: Secondary | ICD-10-CM

## 2016-06-13 MED ORDER — LISDEXAMFETAMINE DIMESYLATE 20 MG PO CAPS: 20.0000 mg | ORAL_CAPSULE | Freq: Every day | ORAL | 0 refills | Status: DC

## 2016-06-13 MED ORDER — HYDROCODONE-ACETAMINOPHEN 7.5-325 MG PO TABS: ORAL_TABLET | ORAL | 0 refills | Status: DC

## 2016-06-13 NOTE — Progress Notes (Signed)
OFFICE VISIT  06/13/2016   CC:  Chief Complaint  Patient presents with  . Follow-up    ADD   HPI:    Patient is a 41 y.o.  female who presents for 6 mo f/u adult ADHD. 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 Vyvanse 20 mg qd helping adequately with focus/concentration/motivation.  No adverse side effects. She takes this med daily.  With stress of new job, different shift schedule, her migraines have been occurring more.  Has been having 1 per week lately.  Uses zomig as first line tx, occ has to use 7.5mg  lortab if this doesn't work. Last visit I gave her a rx for #30 tabs but wrote wrong strength (7.5/500) on rx so pharmacy would not fill this--we never received a call to correct this.  Past Medical History:  Diagnosis Date  . Adult ADHD   . Allergy   . Anemia    History of : ? No iron testing done?  Oral iron no help for Hb per pt.  Vit B12 helps (oral/otc) per pt.  Old record shows only one Hb from 2014 and it was normal.  . Anxiety and depression    when going through separation was on citalopram temporarily.  . Endometriosis   . Migraine syndrome     Past Surgical History:  Procedure Laterality Date  . BREAST LUMPECTOMY W/ NEEDLE LOCALIZATION    . TONSILLECTOMY      Outpatient Medications Prior to Visit  Medication Sig Dispense Refill  . vitamin B-12 (CYANOCOBALAMIN) 1000 MCG tablet Take 1,000 mcg by mouth daily.      Marland Kitchen zolmitriptan (ZOMIG ZMT) 2.5 MG disintegrating tablet TAKE 1 TABLET (2.5 MG TOTAL) BY MOUTH AS NEEDED FOR MIGRAINE. 10 tablet 10  . lisdexamfetamine (VYVANSE) 20 MG capsule Take 1 capsule (20 mg total) by mouth daily. 30 capsule 0  . HYDROcodone-acetaminophen (LORTAB) 7.5-500 MG tablet Take one half to one tab every 4 hours as needed for severe headache (Patient not taking: Reported on 06/13/2016) 30 tablet 0  . PROAIR HFA 108 (90 BASE) MCG/ACT inhaler USE AS NEEDED (Patient not taking: Reported on  06/13/2016) 3 Inhaler 2   No facility-administered medications prior to visit.     No Known Allergies  ROS As per HPI  PE: Blood pressure 120/81, pulse 80, temperature 98.4 F (36.9 C), temperature source Oral, resp. rate 16, height 5\' 5"  (1.651 m), weight 162 lb 4 oz (73.6 kg), SpO2 100 %. Wt Readings from Last 2 Encounters:  06/13/16 162 lb 4 oz (73.6 kg)  11/30/15 158 lb 12.8 oz (72 kg)    Gen: alert, oriented x 4, affect pleasant.  Lucid thinking and conversation noted. HEENT: PERRLA, EOMI.   Neck: no LAD, mass, or thyromegaly. CV: RRR, no m/r/g LUNGS: CTA bilat, nonlabored. NEURO: no tremor or tics noted on observation.  Coordination intact. CN 2-12 grossly intact bilaterally, strength 5/5 in all extremeties.  No ataxia. EXT: 2+ LL pitting edema bilat (chronic per pt): she is wearing compression hose.  LABS:  none  IMPRESSION AND PLAN:  1) Adult ADHD; The current medical regimen is effective;  continue present plan and medications. I printed rx's for vyvanse 20mg , 1 qd, #30 today for this month, April 2018, and May 2018.  Appropriate fill on/after date was noted on each rx.  2) Migraine syndrome: she does not want to try any preventative med at this time. Zomig--first line abortive med. Resort to  vicodin 7.5/325 only if zomig not helping any.  I gave rx for lortab 7.5/325, #30 today.  An After Visit Summary was printed and given to the patient.  FOLLOW UP: Return in about 6 months (around 12/14/2016) for annual CPE (fasting).  Signed:  Crissie Sickles, MD           06/13/2016

## 2016-06-13 NOTE — Progress Notes (Signed)
Pre visit review using our clinic review tool, if applicable. No additional management support is needed unless otherwise documented below in the visit note. 

## 2016-10-17 ENCOUNTER — Other Ambulatory Visit: Payer: Self-pay | Admitting: Family Medicine

## 2016-10-17 ENCOUNTER — Other Ambulatory Visit: Payer: Self-pay | Admitting: *Deleted

## 2016-10-17 MED ORDER — LISDEXAMFETAMINE DIMESYLATE 20 MG PO CAPS: 20.0000 mg | ORAL_CAPSULE | Freq: Every day | ORAL | 0 refills | Status: DC

## 2016-10-17 NOTE — Telephone Encounter (Signed)
Rx put up front for p/u. Pt advised and voiced understanding. She will call back to schedule an apt.

## 2016-10-17 NOTE — Telephone Encounter (Signed)
RF request for vyvanse LOV: 06/13/16 Next ov: None Last written: 06/13/16 #30 w/ 0RF (Given 3 Rx's for March, April and May)  Please advise. Thanks.

## 2016-10-17 NOTE — Telephone Encounter (Signed)
Patient called and states that Dr. Anitra Lauth wrote her a RX for Vyvanse and she is unsure if she needs a med refill appt or if he can write her a refill without the appt. She is due to come back in for a CPE in Sept. She states she gets he CPE at work. Patient is requesting a call back. Her call back number is [phone]. Please advise. Thank you

## 2016-10-17 NOTE — Telephone Encounter (Signed)
Will print vyvanse rx's x 3. Needs ADD f/u office visit in 3 mo.-thx

## 2017-01-18 ENCOUNTER — Encounter: Payer: Self-pay | Admitting: Family Medicine

## 2017-01-18 ENCOUNTER — Ambulatory Visit (INDEPENDENT_AMBULATORY_CARE_PROVIDER_SITE_OTHER): Payer: BLUE CROSS/BLUE SHIELD | Admitting: Family Medicine

## 2017-01-18 ENCOUNTER — Encounter: Payer: Self-pay | Admitting: *Deleted

## 2017-01-18 VITALS — BP 112/75 | HR 71 | Temp 97.9°F | Resp 16 | Ht 65.0 in | Wt 161.5 lb

## 2017-01-18 DIAGNOSIS — F909 Attention-deficit hyperactivity disorder, unspecified type: Secondary | ICD-10-CM | POA: Diagnosis not present

## 2017-01-18 DIAGNOSIS — Z23 Encounter for immunization: Secondary | ICD-10-CM | POA: Diagnosis not present

## 2017-01-18 MED ORDER — LISDEXAMFETAMINE DIMESYLATE 20 MG PO CAPS: 20.0000 mg | ORAL_CAPSULE | Freq: Every day | ORAL | 0 refills | Status: DC

## 2017-01-18 NOTE — Progress Notes (Signed)
OFFICE VISIT  01/18/2017   CC:  Chief Complaint  Patient presents with  . Follow-up    ADD    HPI:    Patient is a 41 y.o.  female who presents for f/u adult ADHD. Doing well on 20 mg qd vyvanse. No side effects.  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  Overall, this is better for her.  Past Medical History:  Diagnosis Date  . Adult ADHD   . Allergy   . Anemia    History of : ? No iron testing done?  Oral iron no help for Hb per pt.  Vit B12 helps (oral/otc) per pt.  Old record shows only one Hb from 2014 and it was normal.  . Anxiety and depression    when going through separation was on citalopram temporarily.  . Endometriosis   . Migraine syndrome   . Venous insufficiency of both lower extremities    low Na + compression hose.    Past Surgical History:  Procedure Laterality Date  . BREAST LUMPECTOMY W/ NEEDLE LOCALIZATION    . TONSILLECTOMY      Outpatient Medications Prior to Visit  Medication Sig Dispense Refill  . zolmitriptan (ZOMIG ZMT) 2.5 MG disintegrating tablet TAKE 1 TABLET (2.5 MG TOTAL) BY MOUTH AS NEEDED FOR MIGRAINE. 10 tablet 10  . lisdexamfetamine (VYVANSE) 20 MG capsule Take 1 capsule (20 mg total) by mouth daily. 30 capsule 0  . HYDROcodone-acetaminophen (NORCO) 7.5-325 MG tablet 1-2 tabs po qd prn severe HA that does not respond to zomig (Patient not taking: Reported on 01/18/2017) 30 tablet 0  . vitamin B-12 (CYANOCOBALAMIN) 1000 MCG tablet Take 1,000 mcg by mouth daily.       No facility-administered medications prior to visit.     No Known Allergies  ROS As per HPI  PE: Blood pressure 112/75, pulse 71, temperature 97.9 F (36.6 C), temperature source Oral, resp. rate 16, height 5\' 5"  (1.651 m), weight 161 lb 8 oz (73.3 kg), last menstrual period 01/16/2017, SpO2 98 %. Wt Readings from Last 2 Encounters:  01/18/17 161 lb 8 oz (73.3 kg)  06/13/16 162 lb 4 oz (73.6 kg)    Gen: alert, oriented x 4, affect pleasant.  Lucid  thinking and conversation noted. HEENT: PERRLA, EOMI.   Neck: no LAD, mass, or thyromegaly. CV: RRR, no m/r/g LUNGS: CTA bilat, nonlabored. NEURO: no tremor or tics noted on observation.  Coordination intact. CN 2-12 grossly intact bilaterally, strength 5/5 in all extremeties.  No ataxia.   LABS:    Chemistry      Component Value Date/Time   NA 140 12/26/2009 1131   K 3.7 12/26/2009 1131   CL 104 12/26/2009 1131   CO2 29 12/26/2009 1131   BUN 13 12/26/2009 1131   CREATININE 0.7 12/26/2009 1131      Component Value Date/Time   CALCIUM 9.6 12/26/2009 1131   ALKPHOS 52 12/26/2009 1131   AST 20 12/26/2009 1131   ALT 20 12/26/2009 1131   BILITOT 1.1 12/26/2009 1131     Lab Results  Component Value Date   WBC 7.3 12/26/2009   HGB 13.3 07/06/2010   HCT 40.2 12/26/2009   MCV 92.8 12/26/2009   PLT 234.0 12/26/2009    IMPRESSION AND PLAN:  Adult ADD; doing well on vyvanse 20mg  qd. CSC signed and in chart today. Will do UDS next f/u visit. I printed rx's for vyvanse 20mg , 1 qAM, #30 today for this month,  Dec 2018, and Jan 2019.Marland Kitchen  Appropriate fill on/after date was noted on each rx. Flu vaccine given today.  An After Visit Summary was printed and given to the patient.  FOLLOW UP: Return in about 3 months (around 04/20/2017) for f/u adult ADD.  Signed:  Crissie Sickles, MD           01/18/2017

## 2017-01-18 NOTE — Addendum Note (Signed)
Addended by: Onalee Hua on: 01/18/2017 09:16 AM   Modules accepted: Orders

## 2017-04-15 ENCOUNTER — Other Ambulatory Visit: Payer: Self-pay | Admitting: Family Medicine

## 2017-04-15 NOTE — Telephone Encounter (Signed)
Vyvanse refill Last OV: 01/18/17 Last Refill:01/18/17 Pharmacy:[facility] [place]

## 2017-04-15 NOTE — Telephone Encounter (Signed)
Copied from [place] [id number]. Topic: Quick Communication - See Telephone Encounter >> Apr 15, 2017  2:55 PM Bea Graff, NT wrote: CRM for notification. See Telephone encounter for: Pt is scheduled for an appt this Friday but will be out of her lisdexamfetamine (VYVANSE) on Thursday and wants to see if a partial refill can be called into [facility] in [place].   04/15/17.

## 2017-04-17 ENCOUNTER — Other Ambulatory Visit: Payer: Self-pay | Admitting: Family Medicine

## 2017-04-17 MED ORDER — LISDEXAMFETAMINE DIMESYLATE 20 MG PO CAPS: 20.0000 mg | ORAL_CAPSULE | Freq: Every day | ORAL | 0 refills | Status: DC

## 2017-04-17 NOTE — Telephone Encounter (Signed)
Pt requesting Rx for Vyvanse. She will be out by Thursday. Pt has apt with Dr. Anitra Lauth on 04/19/17.   Please advise. Thanks.   ----------------------------------------------------  We were unaware of this request. I reviewed pts chart and seen were pt called on 04/15/17 but message was not transferred to our office.   This is a controlled medication, pt will need to pick up a hard copy Rx to take to her pharmacy.   Left message for pt to call back.

## 2017-04-17 NOTE — Telephone Encounter (Signed)
Rx printed, signed and put up front for p/u.  Left detailed message on cell vm, okay per pts request below.   Okay for PEC to advise pt, it she calls back.

## 2017-04-17 NOTE — Telephone Encounter (Signed)
Copied from [place]. Topic: Inquiry >> Apr 17, 2017 10:32 AM Corie Chiquito, [place] wrote: Reason for CRM: Patient calling to check the status of her partial refill for the Lisdexamfetamine (Vyvanse). She would still like for [facility] [place]. Patient does have an appointment to be seen on Friday as well she is just out of medication until then. If someone could give her a call back to let her know if she can or can't have the partial refill at [phone]. If there isn't an answer please leave msg

## 2017-04-19 ENCOUNTER — Ambulatory Visit: Payer: BLUE CROSS/BLUE SHIELD | Admitting: Family Medicine

## 2017-04-19 ENCOUNTER — Encounter: Payer: Self-pay | Admitting: Family Medicine

## 2017-04-19 VITALS — BP 123/79 | HR 80 | Temp 98.1°F | Resp 16 | Ht 65.0 in | Wt 152.5 lb

## 2017-04-19 DIAGNOSIS — F909 Attention-deficit hyperactivity disorder, unspecified type: Secondary | ICD-10-CM

## 2017-04-19 MED ORDER — LISDEXAMFETAMINE DIMESYLATE 40 MG PO CAPS: 40.0000 mg | ORAL_CAPSULE | ORAL | 0 refills | Status: DC

## 2017-04-19 NOTE — Progress Notes (Signed)
OFFICE VISIT  04/19/2017   CC:  Chief Complaint  Patient presents with  . Follow-up    RCI   HPI:    Patient is a 42 y.o. Caucasian female who presents for 3 mo f/u adult ADD. Has been doing well at recent f/u on vyvanse 20mg  qd. I wrote a rx for this med 2 d/a--30 day supply.  Pt states things are mostly going well at current dosing: much improved focus, concentration, task completion.  Less frustration, better multitasking, less impulsivity and restlessness.  Mood is stable. No side effects from the medication. The only thing she wonders is whether or not a dose increase will lead to maximal therapeutic effect AND last a little longer.  She takes med around 5:30 AM and feels it wearing off around 3 pm and gets mildly impatient/irritable. She often has to work up to 6 pm.    Past Medical History:  Diagnosis Date  . Adult ADHD   . Allergy   . Anemia    History of : ? No iron testing done?  Oral iron no help for Hb per pt.  Vit B12 helps (oral/otc) per pt.  Old record shows only one Hb from 2014 and it was normal.  . Anxiety and depression    when going through separation was on citalopram temporarily.  . Endometriosis   . Migraine syndrome   . Venous insufficiency of both lower extremities    low Na + compression hose.    Past Surgical History:  Procedure Laterality Date  . BREAST LUMPECTOMY W/ NEEDLE LOCALIZATION    . TONSILLECTOMY      Outpatient Medications Prior to Visit  Medication Sig Dispense Refill  . zolmitriptan (ZOMIG ZMT) 2.5 MG disintegrating tablet TAKE 1 TABLET (2.5 MG TOTAL) BY MOUTH AS NEEDED FOR MIGRAINE. 10 tablet 10  . lisdexamfetamine (VYVANSE) 20 MG capsule Take 1 capsule (20 mg total) by mouth daily. 30 capsule 0   No facility-administered medications prior to visit.     No Known Allergies  ROS As per HPI  PE: Blood pressure 123/79, pulse 80, temperature 98.1 F (36.7 C), temperature source Oral, resp. rate 16, height 5\' 5"  (1.651 m),  weight 152 lb 8 oz (69.2 kg), SpO2 92 %. Wt Readings from Last 2 Encounters:  04/19/17 152 lb 8 oz (69.2 kg)  01/18/17 161 lb 8 oz (73.3 kg)    Gen: alert, oriented x 4, affect pleasant.  Lucid thinking and conversation noted. HEENT: PERRLA, EOMI.   Neck: no LAD, mass, or thyromegaly. CV: RRR, no m/r/g LUNGS: CTA bilat, nonlabored. NEURO: no tremor or tics noted on observation.  Coordination intact. CN 2-12 grossly intact bilaterally, strength 5/5 in all extremeties.  No ataxia.   LABS:    Chemistry      Component Value Date/Time   NA 140 12/26/2009 1131   K 3.7 12/26/2009 1131   CL 104 12/26/2009 1131   CO2 29 12/26/2009 1131   BUN 13 12/26/2009 1131   CREATININE 0.7 12/26/2009 1131      Component Value Date/Time   CALCIUM 9.6 12/26/2009 1131   ALKPHOS 52 12/26/2009 1131   AST 20 12/26/2009 1131   ALT 20 12/26/2009 1131   BILITOT 1.1 12/26/2009 1131       IMPRESSION AND PLAN:  Adult ADD: 80% improved on vyvanse 20mg  qd.  Will raise dose to 40 mg qAM and have her try to take it a little later in the morning --around 6:30 or 7  am--to try to get therapeutic effect up to about 6 pm. She'll take 2 of her vyvanse 20mg  qAM until current rx is out, then she'll fill rx's for vyvanse 40mg . I printed rx's for vyvanse 40mg , 1 qAM, #30 today for feb, Mar, and April 2019.  Appropriate fill on/after date was noted on each rx. CSC in place. Need to do UDS at next f/u visit.  An After Visit Summary was printed and given to the patient.  FOLLOW UP: Return in about 3 months (around 07/17/2017) for f/u adult add.  Signed:  Crissie Sickles, MD           04/19/2017

## 2017-07-04 ENCOUNTER — Encounter: Payer: Self-pay | Admitting: Family Medicine

## 2017-07-04 ENCOUNTER — Ambulatory Visit (INDEPENDENT_AMBULATORY_CARE_PROVIDER_SITE_OTHER): Payer: BLUE CROSS/BLUE SHIELD | Admitting: Family Medicine

## 2017-07-04 VITALS — BP 127/93 | HR 91 | Temp 97.5°F | Ht 65.0 in | Wt 140.2 lb

## 2017-07-04 DIAGNOSIS — Z79899 Other long term (current) drug therapy: Secondary | ICD-10-CM | POA: Diagnosis not present

## 2017-07-04 DIAGNOSIS — F909 Attention-deficit hyperactivity disorder, unspecified type: Secondary | ICD-10-CM

## 2017-07-04 MED ORDER — LISDEXAMFETAMINE DIMESYLATE 40 MG PO CAPS: 40.0000 mg | ORAL_CAPSULE | ORAL | 0 refills | Status: DC

## 2017-07-04 NOTE — Progress Notes (Signed)
OFFICE VISIT  07/04/2017   CC:  Chief Complaint  Patient presents with  . Follow-up    adult ADD   HPI:    Patient is a 42 y.o.  female who presents for f/u adult ADD. At last f/u visit I increased her vyvanse from 20 to 40 mg qd in hopes that the beneficial effects of the med would be of a bit longer duration---as is sometiimes seen with up-titration of vyvanse. Says dose change helps. Unfortunately she got let go from her job for no reason. She is looking for a new job currently.  Past Medical History:  Diagnosis Date  . Adult ADHD   . Allergy   . Anemia    History of : ? No iron testing done?  Oral iron no help for Hb per pt.  Vit B12 helps (oral/otc) per pt.  Old record shows only one Hb from 2014 and it was normal.  . Anxiety and depression    when going through separation was on citalopram temporarily.  . Endometriosis   . Migraine syndrome   . Venous insufficiency of both lower extremities    low Na + compression hose.    Past Surgical History:  Procedure Laterality Date  . BREAST LUMPECTOMY W/ NEEDLE LOCALIZATION    . TONSILLECTOMY      Outpatient Medications Prior to Visit  Medication Sig Dispense Refill  . zolmitriptan (ZOMIG ZMT) 2.5 MG disintegrating tablet TAKE 1 TABLET (2.5 MG TOTAL) BY MOUTH AS NEEDED FOR MIGRAINE. 10 tablet 10  . lisdexamfetamine (VYVANSE) 40 MG capsule Take 1 capsule (40 mg total) by mouth every morning. 30 capsule 0   No facility-administered medications prior to visit.     No Known Allergies  ROS As per HPI  PE: Blood pressure (!) 127/93, pulse 91, temperature (!) 97.5 F (36.4 C), temperature source Oral, height 5\' 5"  (1.651 m), weight 140 lb 3.2 oz (63.6 kg), last menstrual period 06/16/2017, SpO2 99 %. Wt Readings from Last 2 Encounters:  07/04/17 140 lb 3.2 oz (63.6 kg)  04/19/17 152 lb 8 oz (69.2 kg)    Gen: alert, oriented x 4, affect pleasant.  Lucid thinking and conversation noted. HEENT: PERRLA, EOMI.   Neck: no  LAD, mass, or thyromegaly. CV: RRR, no m/r/g LUNGS: CTA bilat, nonlabored. NEURO: no tremor or tics noted on observation.  Coordination intact. CN 2-12 grossly intact bilaterally, strength 5/5 in all extremeties.  No ataxia.   LABS:    Chemistry      Component Value Date/Time   NA 140 12/26/2009 1131   K 3.7 12/26/2009 1131   CL 104 12/26/2009 1131   CO2 29 12/26/2009 1131   BUN 13 12/26/2009 1131   CREATININE 0.7 12/26/2009 1131      Component Value Date/Time   CALCIUM 9.6 12/26/2009 1131   ALKPHOS 52 12/26/2009 1131   AST 20 12/26/2009 1131   ALT 20 12/26/2009 1131   BILITOT 1.1 12/26/2009 1131       IMPRESSION AND PLAN:  Adult ADD: The current medical regimen is effective;  continue present plan and medications. I printed rx's for vyvans 40mg  qd, #30 today for this month, May 2019, and June 2019.  Appropriate fill on/after date was noted on each rx.  Pt will be coming in with her son next week for his appt and at that time I'll do labs for her insurance AND UDS. An After Visit Summary was printed and given to the patient.  FOLLOW UP:  Return in about 3 months (around 10/03/2017) for routine chronic illness f/u.  Signed:  Crissie Sickles, MD           07/04/2017

## 2017-07-09 ENCOUNTER — Telehealth: Payer: Self-pay | Admitting: *Deleted

## 2017-07-09 DIAGNOSIS — Z79899 Other long term (current) drug therapy: Secondary | ICD-10-CM

## 2017-07-09 DIAGNOSIS — Z0189 Encounter for other specified special examinations: Secondary | ICD-10-CM

## 2017-07-09 DIAGNOSIS — Z13220 Encounter for screening for lipoid disorders: Secondary | ICD-10-CM

## 2017-07-09 NOTE — Telephone Encounter (Signed)
Future orders signed. Make sure pt has lab appt the day her son comes in for visit---I told her at least twice that she needed to get on lab schedule. Also, remind pt to be fasting for 12 hours prior to labs---drink lots of water and she can take meds as usual.-thx

## 2017-07-09 NOTE — Telephone Encounter (Signed)
Please advise. Thanks.  

## 2017-07-09 NOTE — Telephone Encounter (Signed)
Copied from [place] [id number]. Topic: General - Other >> Jul 09, 2017 11:18 AM Oneta Rack wrote: Relation to pt: self  Call back number:[phone]   Reason for call:  Patient states PCP was going to place orders for "tc / hdl  tobacco" & "UDS, patient son has an appointment with Dr. Anitra Lauth Friday, 07/12/17 and patient would like labs drawn that day, please advise  >> Jul 09, 2017 11:22 AM Oneta Rack wrote: Relation to pt: self  Call back number:[phone]   Reason for call:  Patient states PCP was going to place orders for "tc / hdl  tobacco" & "UDS, patient son has an appointment with Dr. Anitra Lauth Friday, 07/12/17 and patient would like labs drawn that day, please advise

## 2017-07-09 NOTE — Telephone Encounter (Signed)
Left message for pt to call back. (I did put pt on lab schedule for 1pm (pts son's apt is at 1:15pm w/ Dr. Anitra Lauth)).

## 2017-07-11 NOTE — Telephone Encounter (Signed)
Left detailed message on cell vm, okay per DPR.  

## 2017-07-12 ENCOUNTER — Other Ambulatory Visit (INDEPENDENT_AMBULATORY_CARE_PROVIDER_SITE_OTHER): Payer: BLUE CROSS/BLUE SHIELD

## 2017-07-12 DIAGNOSIS — Z79899 Other long term (current) drug therapy: Secondary | ICD-10-CM | POA: Diagnosis not present

## 2017-07-12 DIAGNOSIS — Z0189 Encounter for other specified special examinations: Secondary | ICD-10-CM | POA: Diagnosis not present

## 2017-07-12 DIAGNOSIS — Z13220 Encounter for screening for lipoid disorders: Secondary | ICD-10-CM | POA: Diagnosis not present

## 2017-07-12 LAB — LIPID PANEL
Cholesterol: 181 mg/dL (ref 0–200)
HDL: 60.9 mg/dL (ref >39.00)
LDL Cholesterol: 109 mg/dL — ABNORMAL HIGH (ref 0–99)
NonHDL: 120.44
Total CHOL/HDL Ratio: 3
Triglycerides: 57 mg/dL (ref 0.0–149.0)
VLDL: 11.4 mg/dL (ref 0.0–40.0)

## 2017-07-15 LAB — PAIN MGMT, PROFILE 8 W/CONF, U
6 Acetylmorphine: NEGATIVE ng/mL (ref <10)
Alcohol Metabolites: NEGATIVE ng/mL (ref <500)
Amphetamine: 22255 ng/mL — ABNORMAL HIGH (ref <250)
Amphetamines: POSITIVE ng/mL — AB (ref <500)
Benzodiazepines: NEGATIVE ng/mL (ref <100)
Buprenorphine, Urine: NEGATIVE ng/mL (ref <5)
Cocaine Metabolite: NEGATIVE ng/mL (ref <150)
Creatinine: 234.2 mg/dL
MDMA: NEGATIVE ng/mL (ref <500)
Marijuana Metabolite: NEGATIVE ng/mL (ref <20)
Methamphetamine: NEGATIVE ng/mL (ref <250)
Opiates: NEGATIVE ng/mL (ref <100)
Oxidant: NEGATIVE ug/mL (ref <200)
Oxycodone: NEGATIVE ng/mL (ref <100)
pH: 5.91 (ref 4.5–9.0)

## 2017-07-15 LAB — NICOTINE/COTININE METABOLITES
Cotinine: NOT DETECTED ng/mL
Nicotine: NOT DETECTED ng/mL

## 2017-07-16 ENCOUNTER — Encounter: Payer: Self-pay | Admitting: *Deleted

## 2017-07-22 ENCOUNTER — Encounter: Payer: Self-pay | Admitting: *Deleted

## 2017-10-22 ENCOUNTER — Other Ambulatory Visit: Payer: Self-pay | Admitting: Family Medicine

## 2017-10-22 MED ORDER — LISDEXAMFETAMINE DIMESYLATE 40 MG PO CAPS: 40.0000 mg | ORAL_CAPSULE | ORAL | 0 refills | Status: DC

## 2017-10-22 NOTE — Telephone Encounter (Signed)
Vyvanse refill.Patient states she will be out of town next week and will be out of the medication on Friday. Pt states she knows she is due for an appointment.  Last Refill:09/01/17 #30 Last OV: 07/04/17 PCP: Dr. Anitra Lauth [facility]  [address]

## 2017-10-22 NOTE — Progress Notes (Signed)
New vyvanse rx eRx'd-->I accidentally did a PRINTED rx today and I shredded this.

## 2017-10-22 NOTE — Telephone Encounter (Signed)
Copied from [place] [id number]. Topic: Quick Communication - See Telephone Encounter >> Oct 22, 2017 12:23 PM Vernona Rieger wrote: CRM for notification. See Telephone encounter for: 10/22/17.  lisdexamfetamine (VYVANSE) 40 MG capsule  [id number] [place] [place] - [address] AT [place] [address]  Patient will be out of town next week. She will be out of the medication on Friday and know she is due for an appointment.

## 2017-10-22 NOTE — Telephone Encounter (Signed)
RF request for Vyvanse LOV: 07/04/17 Next ov: None Last written: 07/04/17 #30 w/ 0RF (given Rx for April, May and June)  Please advise. Thanks.

## 2017-10-23 NOTE — Telephone Encounter (Signed)
Left detailed message on cell vm, okay per DPR.  

## 2017-11-04 ENCOUNTER — Ambulatory Visit (INDEPENDENT_AMBULATORY_CARE_PROVIDER_SITE_OTHER): Payer: BLUE CROSS/BLUE SHIELD | Admitting: Family Medicine

## 2017-11-04 ENCOUNTER — Encounter: Payer: Self-pay | Admitting: Family Medicine

## 2017-11-04 VITALS — BP 117/82 | HR 84 | Temp 98.0°F | Resp 16 | Ht 65.0 in | Wt 139.0 lb

## 2017-11-04 DIAGNOSIS — J0100 Acute maxillary sinusitis, unspecified: Secondary | ICD-10-CM | POA: Diagnosis not present

## 2017-11-04 MED ORDER — METHYLPREDNISOLONE ACETATE 80 MG/ML IJ SUSP
80.0000 mg | Freq: Once | INTRAMUSCULAR | Status: AC
  Administered 2017-11-04: 80 mg via INTRAMUSCULAR

## 2017-11-04 MED ORDER — DOXYCYCLINE HYCLATE 100 MG PO TABS: 100.0000 mg | ORAL_TABLET | Freq: Two times a day (BID) | ORAL | 0 refills | Status: DC

## 2017-11-04 MED ORDER — GUAIFENESIN-CODEINE 100-10 MG/5ML PO SOLN: 5.0000 mL | Freq: Every day | ORAL | 0 refills | Status: DC

## 2017-11-04 NOTE — Progress Notes (Signed)
Carol Beasley , January 01, 1976, 42 y.o., female MRN: [id number] Patient Care Team    Relationship Specialty Notifications Start End  McGowen, Adrian Blackwater, MD PCP - General Family Medicine  11/30/15   Servando Salina, MD Consulting Physician Obstetrics and Gynecology  06/13/16   Stark Klein, MD Consulting Physician General Surgery  06/13/16     Chief Complaint  Patient presents with  . URI    cough     Subjective: Pt presents for an OV with complaints of cough of > 8 days duration.  Associated symptoms include sinus pressure, headache, cough, chest tightness, nasal congestion and swollen glands. She denies fever, chills, nausea or vomit. She reports the cough is keeping her up all through the night.   Pt has tried tylenol cold and sinus to ease their symptoms.   Depression screen [facility] 2/9 04/19/2017 11/30/2015  Decreased Interest 0 0  Down, Depressed, Hopeless 0 0  PHQ - 2 Score 0 0  Altered sleeping 0 -  Tired, decreased energy 0 -  Change in appetite 0 -  Feeling bad or failure about yourself  0 -  Trouble concentrating 0 -  Moving slowly or fidgety/restless 0 -  Suicidal thoughts 0 -  PHQ-9 Score 0 -    No Known Allergies Social History   Tobacco Use  . Smoking status: Never Smoker  . Smokeless tobacco: Never Used  Substance Use Topics  . Alcohol use: No   Past Medical History:  Diagnosis Date  . Adult ADHD   . Allergy   . Anemia    History of : ? No iron testing done?  Oral iron no help for Hb per pt.  Vit B12 helps (oral/otc) per pt.  Old record shows only one Hb from 2014 and it was normal.  . Anxiety and depression    when going through separation was on citalopram temporarily.  . Endometriosis   . Migraine syndrome   . Venous insufficiency of both lower extremities    low Na + compression hose.   Past Surgical History:  Procedure Laterality Date  . BREAST LUMPECTOMY W/ NEEDLE LOCALIZATION    . TONSILLECTOMY     Family History  Problem Relation Age  of Onset  . Cancer Mother        Ovarian  . Hypertension Mother   . Irritable bowel syndrome Mother   . Crohn's disease Mother   . COPD Father   . Hypertension Sister   . Hypertension Brother    Allergies as of 11/04/2017   No Known Allergies     Medication List        Accurate as of 11/04/17  9:00 AM. Always use your most recent med list.          lisdexamfetamine 40 MG capsule Commonly known as:  VYVANSE Take 1 capsule (40 mg total) by mouth every morning.   zolmitriptan 2.5 MG disintegrating tablet Commonly known as:  ZOMIG-ZMT TAKE 1 TABLET (2.5 MG TOTAL) BY MOUTH AS NEEDED FOR MIGRAINE.       All past medical history, surgical history, allergies, family history, immunizations andmedications were updated in the EMR today and reviewed under the history and medication portions of their EMR.     ROS: Negative, with the exception of above mentioned in HPI   Objective:  BP 117/82 (BP Location: Left Arm, Patient Position: Sitting, Cuff Size: Normal)   Pulse 84   Temp 98 F (36.7 C) (Oral)   Resp 16  Ht 5\' 5"  (1.651 m)   Wt 139 lb (63 kg)   SpO2 99%   BMI 23.13 kg/m  Body mass index is 23.13 kg/m. Gen: Afebrile. No acute distress. Nontoxic in appearance, well developed, well nourished. Appears congested and fatigued. HENT: AT. [place]. Bilateral TM visualized without erythema or bulging. . MMM, no oral lesions. Bilateral nares with erythema, swelling and drainage. Throat without erythema or exudates. Cough and hoarseness present  Eyes:Pupils Equal Round Reactive to light, Extraocular movements intact,  Conjunctiva without redness, discharge or icterus. Neck/lymp/endocrine: Supple,mild bilateral ant cervical  lymphadenopathy CV: RRR no murmur, no edema Chest: CTAB, no wheeze or crackles. Good air movement, normal resp effort.  Abd: Soft. NTND. BS present.  Skin: no rashes, purpura or petechiae.  Neuro:  Normal gait. PERLA. EOMi. Alert. Oriented x3  No exam data  present No results found. No results found for this or any previous visit (from the past 24 hour(s)).  Assessment/Plan: Carol Beasley is a 42 y.o. female present for OV for  1. Acute non-recurrent maxillary sinusitis VSS. Lung exam is normal.  - Rest, hydrate.  + flonase, mucinex (DM if cough), nettie pot or nasal saline.  Doxy and guaf/codeine cough syrup prescribed IM depo medrol injection provided today.  If cough present it can last up to 6-8 weeks.  F/U 2 weeks of not improved.   - methylPREDNISolone acetate (DEPO-MEDROL) injection 80 mg   Reviewed expectations re: course of current medical issues.  Discussed self-management of symptoms.  Outlined signs and symptoms indicating need for more acute intervention.  Patient verbalized understanding and all questions were answered.  Patient received an After-Visit Summary.    No orders of the defined types were placed in this encounter.    Note is dictated utilizing voice recognition software. Although note has been proof read prior to signing, occasional typographical errors still can be missed. If any questions arise, please do not hesitate to call for verification.   electronically signed by:  Howard Pouch, DO  [place]

## 2017-11-04 NOTE — Patient Instructions (Signed)
Rest, hydrate.  + flonase, mucinex (DM if cough) through day, nettie pot or nasal saline.  Steroid shot  Provided today. Doxycyline prescribed and codeine/mucinex cough syrup prescribed at night. , take antibiotic until completed.  If cough present it can last up to 6-8 weeks.  F/U 2 weeks of not improved.    Sinusitis, Adult Sinusitis is soreness and inflammation of your sinuses. Sinuses are hollow spaces in the bones around your face. They are located:  Around your eyes.  In the middle of your forehead.  Behind your nose.  In your cheekbones.  Your sinuses and nasal passages are lined with a stringy fluid (mucus). Mucus normally drains out of your sinuses. When your nasal tissues get inflamed or swollen, the mucus can get trapped or blocked so air cannot flow through your sinuses. This lets bacteria, viruses, and funguses grow, and that leads to infection. Follow these instructions at home: Medicines  Take, use, or apply over-the-counter and prescription medicines only as told by your doctor. These may include nasal sprays.  If you were prescribed an antibiotic medicine, take it as told by your doctor. Do not stop taking the antibiotic even if you start to feel better. Hydrate and Humidify  Drink enough water to keep your pee (urine) clear or pale yellow.  Use a cool mist humidifier to keep the humidity level in your home above 50%.  Breathe in steam for 10-15 minutes, 3-4 times a day or as told by your doctor. You can do this in the bathroom while a hot shower is running.  Try not to spend time in cool or dry air. Rest  Rest as much as possible.  Sleep with your head raised (elevated).  Make sure to get enough sleep each night. General instructions  Put a warm, moist washcloth on your face 3-4 times a day or as told by your doctor. This will help with discomfort.  Wash your hands often with soap and water. If there is no soap and water, use hand sanitizer.  Do not  smoke. Avoid being around people who are smoking (secondhand smoke).  Keep all follow-up visits as told by your doctor. This is important. Contact a doctor if:  You have a fever.  Your symptoms get worse.  Your symptoms do not get better within 10 days. Get help right away if:  You have a very bad headache.  You cannot stop throwing up (vomiting).  You have pain or swelling around your face or eyes.  You have trouble seeing.  You feel confused.  Your neck is stiff.  You have trouble breathing. This information is not intended to replace advice given to you by your health care provider. Make sure you discuss any questions you have with your health care provider. Document Released: 08/22/2007 Document Revised: 10/30/2015 Document Reviewed: 12/29/2014 Elsevier Interactive Patient Education  [employer].

## 2017-12-02 ENCOUNTER — Encounter: Payer: Self-pay | Admitting: Family Medicine

## 2017-12-02 ENCOUNTER — Ambulatory Visit (INDEPENDENT_AMBULATORY_CARE_PROVIDER_SITE_OTHER): Payer: BLUE CROSS/BLUE SHIELD | Admitting: Family Medicine

## 2017-12-02 VITALS — BP 109/72 | HR 78 | Temp 98.6°F | Resp 16 | Ht 65.0 in | Wt 141.2 lb

## 2017-12-02 DIAGNOSIS — Z23 Encounter for immunization: Secondary | ICD-10-CM

## 2017-12-02 DIAGNOSIS — F909 Attention-deficit hyperactivity disorder, unspecified type: Secondary | ICD-10-CM

## 2017-12-02 MED ORDER — LISDEXAMFETAMINE DIMESYLATE 40 MG PO CAPS: 40.0000 mg | ORAL_CAPSULE | ORAL | 0 refills | Status: DC

## 2017-12-02 NOTE — Addendum Note (Signed)
Addended by: Tammi Sou on: 12/02/2017 12:37 PM   Modules accepted: Orders

## 2017-12-02 NOTE — Progress Notes (Signed)
OFFICE VISIT  12/02/2017   CC:  Chief Complaint  Patient presents with  . Follow-up    RCI    HPI:    Patient is a 42 y.o. Caucasian female who presents for f/u adult ADD. Got a good new job at a [employer] in [place], [place].  Pt states all is going well with the med at current dosing: much improved focus, concentration, task completion.  Less frustration, better multitasking, less impulsivity and restlessness.  Mood is stable. No side effects from the medication.  Working out almost every day at 3:30 AM. Appetite is good. Seems happier!   Past Medical History:  Diagnosis Date  . Adult ADHD   . Allergy   . Anemia    History of : ? No iron testing done?  Oral iron no help for Hb per pt.  Vit B12 helps (oral/otc) per pt.  Old record shows only one Hb from 2014 and it was normal.  . Anxiety and depression    when going through separation was on citalopram temporarily.  . Endometriosis   . Migraine syndrome   . Venous insufficiency of both lower extremities    low Na + compression hose.    Past Surgical History:  Procedure Laterality Date  . BREAST LUMPECTOMY W/ NEEDLE LOCALIZATION    . TONSILLECTOMY      Outpatient Medications Prior to Visit  Medication Sig Dispense Refill  . lisdexamfetamine (VYVANSE) 40 MG capsule Take 1 capsule (40 mg total) by mouth every morning. 30 capsule 0  . doxycycline (VIBRA-TABS) 100 MG tablet Take 1 tablet (100 mg total) by mouth 2 (two) times daily. (Patient not taking: Reported on 12/02/2017) 20 tablet 0  . guaiFENesin-codeine 100-10 MG/5ML syrup Take 5 mLs by mouth at bedtime. (Patient not taking: Reported on 12/02/2017) 120 mL 0  . zolmitriptan (ZOMIG ZMT) 2.5 MG disintegrating tablet TAKE 1 TABLET (2.5 MG TOTAL) BY MOUTH AS NEEDED FOR MIGRAINE. (Patient not taking: Reported on 11/04/2017) 10 tablet 10   No facility-administered medications prior to visit.     No Known Allergies  ROS As per HPI  PE: Blood pressure 109/72, pulse 78,  temperature 98.6 F (37 C), temperature source Oral, resp. rate 16, height 5\' 5"  (1.651 m), weight 141 lb 4 oz (64.1 kg), SpO2 100 %. Body mass index is 23.51 kg/m.  Wt Readings from Last 2 Encounters:  12/02/17 141 lb 4 oz (64.1 kg)  11/04/17 139 lb (63 kg)    Gen: alert, oriented x 4, affect pleasant.  Lucid thinking and conversation noted. HEENT: PERRLA, EOMI.   Neck: no LAD, mass, or thyromegaly. CV: RRR, no m/r/g LUNGS: CTA bilat, nonlabored. NEURO: no tremor or tics noted on observation.  Coordination intact. CN 2-12 grossly intact bilaterally, strength 5/5 in all extremeties.  No ataxia.   LABS:    Chemistry      Component Value Date/Time   NA 140 12/26/2009 1131   K 3.7 12/26/2009 1131   CL 104 12/26/2009 1131   CO2 29 12/26/2009 1131   BUN 13 12/26/2009 1131   CREATININE 0.7 12/26/2009 1131      Component Value Date/Time   CALCIUM 9.6 12/26/2009 1131   ALKPHOS 52 12/26/2009 1131   AST 20 12/26/2009 1131   ALT 20 12/26/2009 1131   BILITOT 1.1 12/26/2009 1131      IMPRESSION AND PLAN:  Adult ADD: The current medical regimen is effective;  continue present plan and medications. I printed rx's for vyvanse 40mg   qd, #30 today for this month, Aug 2019, and Sept 2019.  Appropriate fill on/after date was noted on each rx.  An After Visit Summary was printed and given to the patient.  FOLLOW UP: Return in about 3 months (around 03/03/2018) for routine chronic illness f/u (pt may switch to an MD in [place], [place] to this f/u).  Signed:  Crissie Sickles, MD           12/02/2017

## 2017-12-02 NOTE — Addendum Note (Signed)
Addended by: Tammi Sou on: 12/02/2017 09:39 AM   Modules accepted: Orders

## 2019-12-06 DIAGNOSIS — R52 Pain, unspecified: Secondary | ICD-10-CM | POA: Diagnosis not present

## 2020-03-19 DIAGNOSIS — N840 Polyp of corpus uteri: Secondary | ICD-10-CM

## 2020-03-19 DIAGNOSIS — Z8742 Personal history of other diseases of the female genital tract: Secondary | ICD-10-CM

## 2020-03-19 HISTORY — DX: Polyp of corpus uteri: N84.0

## 2020-03-19 HISTORY — DX: Personal history of other diseases of the female genital tract: Z87.42

## 2020-06-01 LAB — HM PAP SMEAR: HM Pap smear: ABNORMAL

## 2020-09-27 ENCOUNTER — Ambulatory Visit (INDEPENDENT_AMBULATORY_CARE_PROVIDER_SITE_OTHER): Payer: 59 | Admitting: Family Medicine

## 2020-09-27 ENCOUNTER — Encounter: Payer: Self-pay | Admitting: Family Medicine

## 2020-09-27 ENCOUNTER — Other Ambulatory Visit: Payer: Self-pay

## 2020-09-27 VITALS — BP 115/75 | HR 80 | Temp 97.9°F | Resp 16 | Ht 63.75 in | Wt 156.4 lb

## 2020-09-27 DIAGNOSIS — Z0000 Encounter for general adult medical examination without abnormal findings: Secondary | ICD-10-CM

## 2020-09-27 DIAGNOSIS — G43909 Migraine, unspecified, not intractable, without status migrainosus: Secondary | ICD-10-CM | POA: Diagnosis not present

## 2020-09-27 DIAGNOSIS — D509 Iron deficiency anemia, unspecified: Secondary | ICD-10-CM

## 2020-09-27 DIAGNOSIS — Z8261 Family history of arthritis: Secondary | ICD-10-CM

## 2020-09-27 DIAGNOSIS — Z0001 Encounter for general adult medical examination with abnormal findings: Secondary | ICD-10-CM

## 2020-09-27 DIAGNOSIS — M130 Polyarthritis, unspecified: Secondary | ICD-10-CM | POA: Diagnosis not present

## 2020-09-27 DIAGNOSIS — F988 Other specified behavioral and emotional disorders with onset usually occurring in childhood and adolescence: Secondary | ICD-10-CM

## 2020-09-27 HISTORY — DX: Iron deficiency anemia, unspecified: D50.9

## 2020-09-27 LAB — TSH: TSH: 1.35 u[IU]/mL (ref 0.35–5.50)

## 2020-09-27 LAB — COMPREHENSIVE METABOLIC PANEL WITH GFR
ALT: 18 U/L (ref 0–35)
AST: 17 U/L (ref 0–37)
Albumin: 4.3 g/dL (ref 3.5–5.2)
Alkaline Phosphatase: 42 U/L (ref 39–117)
BUN: 17 mg/dL (ref 6–23)
CO2: 27 meq/L (ref 19–32)
Calcium: 9.3 mg/dL (ref 8.4–10.5)
Chloride: 105 meq/L (ref 96–112)
Creatinine, Ser: 0.82 mg/dL (ref 0.40–1.20)
GFR: 85.79 mL/min (ref >60.00)
Glucose, Bld: 76 mg/dL (ref 70–99)
Potassium: 3.9 meq/L (ref 3.5–5.1)
Sodium: 140 meq/L (ref 135–145)
Total Bilirubin: 0.5 mg/dL (ref 0.2–1.2)
Total Protein: 6.4 g/dL (ref 6.0–8.3)

## 2020-09-27 LAB — CBC WITH DIFFERENTIAL/PLATELET
Basophils Absolute: 0.1 K/uL (ref 0.0–0.1)
Basophils Relative: 1.1 % (ref 0.0–3.0)
Eosinophils Absolute: 0 K/uL (ref 0.0–0.7)
Eosinophils Relative: 0.8 % (ref 0.0–5.0)
HCT: 31.6 % — ABNORMAL LOW (ref 36.0–46.0)
Hemoglobin: 10.4 g/dL — ABNORMAL LOW (ref 12.0–15.0)
Lymphocytes Relative: 21.8 % (ref 12.0–46.0)
Lymphs Abs: 1.3 K/uL (ref 0.7–4.0)
MCHC: 32.8 g/dL (ref 30.0–36.0)
MCV: 83.8 fl (ref 78.0–100.0)
Monocytes Absolute: 0.4 K/uL (ref 0.1–1.0)
Monocytes Relative: 7.6 % (ref 3.0–12.0)
Neutro Abs: 4 K/uL (ref 1.4–7.7)
Neutrophils Relative %: 68.7 % (ref 43.0–77.0)
Platelets: 327 K/uL (ref 150.0–400.0)
RBC: 3.77 Mil/uL — ABNORMAL LOW (ref 3.87–5.11)
RDW: 15.6 % — ABNORMAL HIGH (ref 11.5–15.5)
WBC: 5.8 K/uL (ref 4.0–10.5)

## 2020-09-27 LAB — C-REACTIVE PROTEIN: CRP: <1 mg/dL (ref 0.5–20.0)

## 2020-09-27 LAB — LIPID PANEL
Cholesterol: 152 mg/dL (ref 0–200)
HDL: 57.6 mg/dL (ref >39.00)
LDL Cholesterol: 89 mg/dL (ref 0–99)
NonHDL: 94.5
Total CHOL/HDL Ratio: 3
Triglycerides: 28 mg/dL (ref 0.0–149.0)
VLDL: 5.6 mg/dL (ref 0.0–40.0)

## 2020-09-27 LAB — SEDIMENTATION RATE: Sed Rate: 5 mm/h (ref 0–20)

## 2020-09-27 MED ORDER — LISDEXAMFETAMINE DIMESYLATE 40 MG PO CAPS: 40.0000 mg | ORAL_CAPSULE | ORAL | 0 refills | Status: DC

## 2020-09-27 MED ORDER — ZOLMITRIPTAN 5 MG PO TABS: 5.0000 mg | ORAL_TABLET | ORAL | 6 refills | Status: DC | PRN

## 2020-09-27 MED ORDER — TOPIRAMATE 25 MG PO TABS: 25.0000 mg | ORAL_TABLET | Freq: Two times a day (BID) | ORAL | 1 refills | Status: DC

## 2020-09-27 NOTE — Progress Notes (Signed)
Office Note 09/27/2020  CC:  Chief Complaint  Patient presents with   Annual Exam    Pt is fasting; last GYN, [facility] in [place]. Will be re-establishing with someone here.   HPI:  Carol Beasley is a 45 y.o. female who is here to re-establish care --for annual health maintenance exam and adult ADD f/u. I last saw her 07/04/17 for f/u ADD. A/P as of last visit: "Adult ADD: The current medical regimen is effective;  continue present plan and medications. I printed rx's for vyvans 40mg  qd, #30 today for this month, May 2019, and June 2019.  Appropriate fill on/after date was noted on each rx. Pt will be coming in with her son next week for his appt and at that time I'll do labs for her insurance AND UDS."  INTERIM HX: Since I last saw her she has been getting care through PCP in [place], [place]-->Dr. Evelina Bucy. Most recent visit with him in EMR was 05/05/20 for f/u ADD. Most recent CPE 11/17/19->she was referred to GYN for cerv ca and breast ca screening at that time, was also referred for PT for chronic R knee pain.  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  Pt states all is going well with the med at current dosing: much improved focus, concentration, task completion.  Less frustration, better multitasking, less impulsivity and restlessness.  Mood is stable. No side effects from the medication. PMP AWARE reviewed today: most recent rx for vyvanse 40mg  was filled 08/26/20, # 55, rx by Dr. Evelina Bucy. No red flags.  Divorced now, says migraines more frequent as a result. Zomig 5mg  as abortive works well (sublingual). Has had waxing and waning HA for a week now but reserves zomig for very severe HA--gradually improving with excedrin migraine use. Uses zomig 4-5d per month lately. Has never been on preventative med in the past.  Remote hx of seeing HA clinic. Says lots of HAs in the past have been  stress induced. Typical migraine for her --Throbbing bilat frontotemp pain, nausea, sometimes photophobia.  Pt asks about RA testing, says mom has hx of RA.  Also, pt reports some hx of recurrent swelling/redness/pain with ROM in 1st and 2nd MCP areas on R hand.  Says feet "crampy" and stiff at times as well but notes no swelling of ankles/feet/toes or redness in these regions.  Past Medical History:  Diagnosis Date   Adult ADHD    Allergy    Anemia    History of : ? No iron testing done?  Oral iron no help for Hb per pt.  Vit B12 helps (oral/otc) per pt.  Old record shows only one Hb from 2014 and it was normal.   Anxiety and depression    when going through separation was on citalopram temporarily.   Endometriosis    Migraine syndrome    Venous insufficiency of both lower extremities    low Na + compression hose.    Past Surgical History:  Procedure Laterality Date   BREAST LUMPECTOMY W/ NEEDLE LOCALIZATION     TONSILLECTOMY      Family History  Problem Relation Age of Onset   Cancer Mother        Ovarian   Hypertension Mother    Irritable bowel syndrome Mother    Crohn's disease Mother    Rheum arthritis Mother    COPD Father    Hypertension Sister    Hypertension  Brother     Social History   Socioeconomic History   Marital status: Married    Spouse name: Not on file   Number of children: Not on file   Years of education: Not on file   Highest education level: Not on file  Occupational History   Not on file  Tobacco Use   Smoking status: Never   Smokeless tobacco: Never  Substance and Sexual Activity   Alcohol use: No   Drug use: No   Sexual activity: Yes    Comment: husband - vasectomy  Other Topics Concern   Not on file  Social History Narrative   Married, 3 sons, 1 daughter.   Educ: Masters   Occup: Firefighter in [place]   No T/A/Ds.   Exercise: couple days a week (biking, other).   Social Determinants of Health   Financial Resource Strain: Not  on file  Food Insecurity: Not on file  Transportation Needs: Not on file  Physical Activity: Not on file  Stress: Not on file  Social Connections: Not on file  Intimate Partner Violence: Not on file    Outpatient Medications Prior to Visit  Medication Sig Dispense Refill   cyanocobalamin 100 MCG tablet Take 500 mcg by mouth daily.     lisdexamfetamine (VYVANSE) 40 MG capsule Take 1 capsule (40 mg total) by mouth every morning. 30 capsule 0   zolmitriptan (ZOMIG) 5 MG tablet Take 5 mg by mouth as needed for migraine.     No facility-administered medications prior to visit.   No Known Allergies  ROS Review of Systems  Constitutional:  Negative for appetite change, chills, fatigue and fever.  HENT:  Negative for congestion, dental problem, ear pain and sore throat.   Eyes:  Negative for discharge, redness and visual disturbance.  Respiratory:  Negative for cough, chest tightness, shortness of breath and wheezing.   Cardiovascular:  Negative for chest pain, palpitations and leg swelling.  Gastrointestinal:  Negative for abdominal pain, blood in stool, diarrhea, nausea and vomiting.  Genitourinary:  Negative for difficulty urinating, dysuria, flank pain, frequency, hematuria and urgency.  Musculoskeletal:  Positive for joint swelling (as per hpi). Negative for arthralgias, back pain, myalgias and neck stiffness.  Skin:  Negative for pallor and rash.  Neurological:  Negative for dizziness, speech difficulty, weakness and headaches.  Hematological:  Negative for adenopathy. Does not bruise/bleed easily.  Psychiatric/Behavioral:  Negative for confusion and sleep disturbance. The patient is not nervous/anxious.    PE; Vitals with BMI 09/27/2020 12/02/2017 11/04/2017  Height 5' 3.75" 5\' 5"  5\' 5"   Weight 156 lbs 6 oz 141 lbs 4 oz 139 lbs  BMI 27.06 09.60 45.40  Systolic 981 191 478  Diastolic 75 72 82  Pulse 80 78 84   Gen: Alert, well appearing.  Patient is oriented to person, place,  time, and situation. AFFECT: pleasant, lucid thought and speech. ENT: Ears: EACs clear, normal epithelium.  TMs with good light reflex and landmarks bilaterally.  Eyes: no injection, icteris, swelling, or exudate.  EOMI, PERRLA. Nose: no drainage or turbinate edema/swelling.  No injection or focal lesion.  Mouth: lips without lesion/swelling.  Oral mucosa pink and moist.  Dentition intact and without obvious caries or gingival swelling.  Oropharynx without erythema, exudate, or swelling.  Neck: supple/nontender.  No LAD, mass, or TM.  Carotid pulses 2+ bilaterally, without bruits. CV: RRR, no m/r/g.   LUNGS: CTA bilat, nonlabored resps, good aeration in all lung fields. ABD: soft, NT, ND, BS  normal.  No hepatospenomegaly or mass.  No bruits. EXT: no clubbing, cyanosis, or edema.  Musculoskeletal: no joint swelling, erythema, warmth, or tenderness.  ROM of all joints intact. Skin - no sores or suspicious lesions or rashes or color changes Neuro: CN 2-12 intact bilaterally, strength 5/5 in proximal and distal upper extremities and lower extremities bilaterally.  No sensory deficits.  No tremor.  No disdiadochokinesis.  No ataxia.  Upper extremity and lower extremity DTRs symmetric.  No pronator drift.  Pertinent labs:  No results found for: TSH Lab Results  Component Value Date   WBC 7.3 12/26/2009   HGB 13.3 07/06/2010   HCT 40.2 12/26/2009   MCV 92.8 12/26/2009   PLT 234.0 12/26/2009   Lab Results  Component Value Date   CREATININE 0.7 12/26/2009   BUN 13 12/26/2009   NA 140 12/26/2009   K 3.7 12/26/2009   CL 104 12/26/2009   CO2 29 12/26/2009   Lab Results  Component Value Date   ALT 20 12/26/2009   AST 20 12/26/2009   ALKPHOS 52 12/26/2009   BILITOT 1.1 12/26/2009   Lab Results  Component Value Date   CHOL 181 07/12/2017   Lab Results  Component Value Date   HDL 60.90 07/12/2017   Lab Results  Component Value Date   LDLCALC 109 (H) 07/12/2017   Lab Results   Component Value Date   TRIG 57.0 07/12/2017   Lab Results  Component Value Date   CHOLHDL 3 07/12/2017    ASSESSMENT AND PLAN:   1) Migraine HAs, not well controlled.  Recent inc stress with divorce, new job, the move back to [place] all contributing some.  Discussed trial of prophylactic migraine med and she is agreeable to trial of topamax 25mg  bid.  Continue zomig as abortive med.  2) Adult ADD.  Stable on vyvanse 40mg  qd long term. CSC today. Vyvanse 40mg  qd, #30 eRx'd today.  3) Joint swelling (MCPs x 2 R hand), +FH RA in mother-->will check Rh factor  4) Health maintenance exam: Reviewed age and gender appropriate health maintenance issues (prudent diet, regular exercise, health risks of tobacco and excessive alcohol, use of seatbelts, fire alarms in home, use of sunscreen).  Also reviewed age and gender appropriate health screening as well as vaccine recommendations. Vaccines: UTD. Labs: fasting HP ordered. Cervical ca screening: hx of abnl pap and uterine polyps, got eval in [facility] 05/2020 and she'll be establishing with local GYN in near future for removal of polyps and repeat pap. Breast ca screening: She defers this until next cpe in 1 yr. Colon ca screening: average risk patient= as per latest guidelines, start screening at any time now->she wants to put this off until next CPE in 1 yr.  An After Visit Summary was printed and given to the patient.  FOLLOW UP:  Return in about 4 weeks (around 10/25/2020) for f/u migraines.  Signed:  Crissie Sickles, MD           09/27/2020

## 2020-09-27 NOTE — Patient Instructions (Signed)
Health Maintenance, Female Adopting a healthy lifestyle and getting preventive care are important in promoting health and wellness. Ask your health care provider about: The right schedule for you to have regular tests and exams. Things you can do on your own to prevent diseases and keep yourself healthy. What should I know about diet, weight, and exercise? Eat a healthy diet  Eat a diet that includes plenty of vegetables, fruits, low-fat dairy products, and lean protein. Do not eat a lot of foods that are high in solid fats, added sugars, or sodium.  Maintain a healthy weight Body mass index (BMI) is used to identify weight problems. It estimates body fat based on height and weight. Your health care provider can help determineyour BMI and help you achieve or maintain a healthy weight. Get regular exercise Get regular exercise. This is one of the most important things you can do for your health. Most adults should: Exercise for at least 150 minutes each week. The exercise should increase your heart rate and make you sweat (moderate-intensity exercise). Do strengthening exercises at least twice a week. This is in addition to the moderate-intensity exercise. Spend less time sitting. Even light physical activity can be beneficial. Watch cholesterol and blood lipids Have your blood tested for lipids and cholesterol at 46 years of age, then havethis test every 5 years. Have your cholesterol levels checked more often if: Your lipid or cholesterol levels are high. You are older than 46 years of age. You are at high risk for heart disease. What should I know about cancer screening? Depending on your health history and family history, you may need to have cancer screening at various ages. This may include screening for: Breast cancer. Cervical cancer. Colorectal cancer. Skin cancer. Lung cancer. What should I know about heart disease, diabetes, and high blood pressure? Blood pressure and heart  disease High blood pressure causes heart disease and increases the risk of stroke. This is more likely to develop in people who have high blood pressure readings, are of African descent, or are overweight. Have your blood pressure checked: Every 3-5 years if you are 18-39 years of age. Every year if you are 40 years old or older. Diabetes Have regular diabetes screenings. This checks your fasting blood sugar level. Have the screening done: Once every three years after age 40 if you are at a normal weight and have a low risk for diabetes. More often and at a younger age if you are overweight or have a high risk for diabetes. What should I know about preventing infection? Hepatitis B If you have a higher risk for hepatitis B, you should be screened for this virus. Talk with your health care provider to find out if you are at risk forhepatitis B infection. Hepatitis C Testing is recommended for: Everyone born from 1945 through 1965. Anyone with known risk factors for hepatitis C. Sexually transmitted infections (STIs) Get screened for STIs, including gonorrhea and chlamydia, if: You are sexually active and are younger than 46 years of age. You are older than 46 years of age and your health care provider tells you that you are at risk for this type of infection. Your sexual activity has changed since you were last screened, and you are at increased risk for chlamydia or gonorrhea. Ask your health care provider if you are at risk. Ask your health care provider about whether you are at high risk for HIV. Your health care provider may recommend a prescription medicine to help   prevent HIV infection. If you choose to take medicine to prevent HIV, you should first get tested for HIV. You should then be tested every 3 months for as long as you are taking the medicine. Pregnancy If you are about to stop having your period (premenopausal) and you may become pregnant, seek counseling before you get  pregnant. Take 400 to 800 micrograms (mcg) of folic acid every day if you become pregnant. Ask for birth control (contraception) if you want to prevent pregnancy. Osteoporosis and menopause Osteoporosis is a disease in which the bones lose minerals and strength with aging. This can result in bone fractures. If you are 65 years old or older, or if you are at risk for osteoporosis and fractures, ask your health care provider if you should: Be screened for bone loss. Take a calcium or vitamin D supplement to lower your risk of fractures. Be given hormone replacement therapy (HRT) to treat symptoms of menopause. Follow these instructions at home: Lifestyle Do not use any products that contain nicotine or tobacco, such as cigarettes, e-cigarettes, and chewing tobacco. If you need help quitting, ask your health care provider. Do not use street drugs. Do not share needles. Ask your health care provider for help if you need support or information about quitting drugs. Alcohol use Do not drink alcohol if: Your health care provider tells you not to drink. You are pregnant, may be pregnant, or are planning to become pregnant. If you drink alcohol: Limit how much you use to 0-1 drink a day. Limit intake if you are breastfeeding. Be aware of how much alcohol is in your drink. In the [place] one drink equals one 12 oz bottle of beer (355 mL), one 5 oz glass of wine (148 mL), or one 1 oz glass of hard liquor (44 mL). General instructions Schedule regular health, dental, and eye exams. Stay current with your vaccines. Tell your health care provider if: You often feel depressed. You have ever been abused or do not feel safe at home. Summary Adopting a healthy lifestyle and getting preventive care are important in promoting health and wellness. Follow your health care provider's instructions about healthy diet, exercising, and getting tested or screened for diseases. Follow your health care provider's  instructions on monitoring your cholesterol and blood pressure. This information is not intended to replace advice given to you by your health care provider. Make sure you discuss any questions you have with your healthcare provider. Document Revised: 02/26/2018 Document Reviewed: 02/26/2018 Elsevier Patient Education  2022 Elsevier Inc.  

## 2020-09-28 ENCOUNTER — Other Ambulatory Visit (INDEPENDENT_AMBULATORY_CARE_PROVIDER_SITE_OTHER): Payer: 59

## 2020-09-28 ENCOUNTER — Encounter: Payer: Self-pay | Admitting: Family Medicine

## 2020-09-28 ENCOUNTER — Other Ambulatory Visit: Payer: Self-pay

## 2020-09-28 ENCOUNTER — Other Ambulatory Visit: Payer: Self-pay | Admitting: Family Medicine

## 2020-09-28 DIAGNOSIS — D509 Iron deficiency anemia, unspecified: Secondary | ICD-10-CM

## 2020-09-28 DIAGNOSIS — D649 Anemia, unspecified: Secondary | ICD-10-CM

## 2020-09-28 LAB — IBC + FERRITIN
Ferritin: 3 ng/mL — ABNORMAL LOW (ref 10.0–291.0)
Iron: 56 ug/dL (ref 42–145)
Saturation Ratios: 13.2 % — ABNORMAL LOW (ref 20.0–50.0)
Transferrin: 302 mg/dL (ref 212.0–360.0)

## 2020-09-28 MED ORDER — FERROUS SULFATE 300 (60 FE) MG/5ML PO SYRP: 300.0000 mg | ORAL_SOLUTION | Freq: Every day | ORAL | 5 refills | Status: DC

## 2020-09-29 ENCOUNTER — Telehealth: Payer: Self-pay

## 2020-09-29 LAB — RHEUMATOID FACTOR: Rheumatoid fact SerPl-aCnc: <14 IU/mL (ref <14)

## 2020-09-29 MED ORDER — ZOLMITRIPTAN 5 MG PO TABS: 5.0000 mg | ORAL_TABLET | ORAL | 6 refills | Status: AC | PRN

## 2020-09-29 NOTE — Telephone Encounter (Signed)
Ok, my fault. Zomig eRx'd today.

## 2020-09-29 NOTE — Addendum Note (Signed)
Addended by: Tammi Sou on: 09/29/2020 03:23 PM   Modules accepted: Orders

## 2020-09-29 NOTE — Telephone Encounter (Signed)
LM for pt to return call regarding rx. Prior prescription sent 7/12 cancelled with pharmacy

## 2020-09-29 NOTE — Telephone Encounter (Signed)
Pt states her insurance will only cover 4 tablets per 30 days for [place] prescription. She did not get recent rx sent 7/12  Please advise, thanks.

## 2020-09-30 ENCOUNTER — Telehealth: Payer: Self-pay

## 2020-09-30 NOTE — Telephone Encounter (Signed)
Confirmed with Elmyra Ricks at the pharmacy, pt has not picked up new rx.

## 2020-09-30 NOTE — Telephone Encounter (Signed)
Pt was recently given rx for ferrous sulfate liquid due to low iron levels.  Please advise, thanks.

## 2020-09-30 NOTE — Telephone Encounter (Signed)
Patient was seen earlier this week. She still does not have any energy.  She has been sleeping a lot.  Is this normal?  Please call [phone]

## 2020-09-30 NOTE — Telephone Encounter (Signed)
It will take a few weeks of taking daily iron to start to feel any effect from it, and the full effect may not be felt for a few months. Also, ask if she feels sad and depressed.

## 2020-10-02 LAB — ANTI-CCP AB, IGG + IGA (RDL): Anti-CCP Ab, IgG + IgA (RDL): <20 Units (ref <20)

## 2020-10-03 NOTE — Telephone Encounter (Signed)
LM for pt to returncall

## 2020-10-03 NOTE — Addendum Note (Signed)
Addended by: Octaviano Glow on: 10/03/2020 01:51 PM   Modules accepted: Orders

## 2020-10-04 ENCOUNTER — Other Ambulatory Visit: Payer: Self-pay | Admitting: Family Medicine

## 2020-10-04 ENCOUNTER — Encounter: Payer: Self-pay | Admitting: Family Medicine

## 2020-10-04 LAB — HEMOCCULT SLIDES (X 3 CARDS)
Fecal Occult Blood: NEGATIVE
OCCULT 1: NEGATIVE
OCCULT 2: NEGATIVE
OCCULT 3: NEGATIVE
OCCULT 4: NEGATIVE
OCCULT 5: NEGATIVE

## 2020-10-04 MED ORDER — PROPRANOLOL HCL ER 80 MG PO CP24: 80.0000 mg | ORAL_CAPSULE | Freq: Every day | ORAL | 1 refills | Status: DC

## 2020-10-04 NOTE — Telephone Encounter (Signed)
Pt states she feels better when she is not taking her topamax. D\c'd topamax and Rx sent by PCP for new migraine medication.

## 2020-10-06 ENCOUNTER — Encounter: Payer: Self-pay | Admitting: Family Medicine

## 2020-10-25 ENCOUNTER — Other Ambulatory Visit: Payer: Self-pay | Admitting: Family Medicine

## 2020-10-25 MED ORDER — LISDEXAMFETAMINE DIMESYLATE 40 MG PO CAPS: 40.0000 mg | ORAL_CAPSULE | ORAL | 0 refills | Status: DC

## 2020-10-25 NOTE — Telephone Encounter (Signed)
Requesting: Vyvanse Contract: 09/27/20 UDS: n/a Last Visit:09/27/20 Next Visit:10/31/20 Last Refill:09/27/20(30,0)  Please Advise. Med pending

## 2020-10-31 ENCOUNTER — Encounter: Payer: Self-pay | Admitting: Family Medicine

## 2020-10-31 ENCOUNTER — Other Ambulatory Visit: Payer: Self-pay

## 2020-10-31 ENCOUNTER — Ambulatory Visit: Payer: 59 | Admitting: Family Medicine

## 2020-10-31 VITALS — BP 127/84 | HR 78 | Temp 98.3°F | Resp 16 | Ht 63.75 in | Wt 156.8 lb

## 2020-10-31 DIAGNOSIS — G43909 Migraine, unspecified, not intractable, without status migrainosus: Secondary | ICD-10-CM

## 2020-10-31 DIAGNOSIS — D649 Anemia, unspecified: Secondary | ICD-10-CM | POA: Diagnosis not present

## 2020-10-31 DIAGNOSIS — D509 Iron deficiency anemia, unspecified: Secondary | ICD-10-CM

## 2020-10-31 DIAGNOSIS — N920 Excessive and frequent menstruation with regular cycle: Secondary | ICD-10-CM | POA: Diagnosis not present

## 2020-10-31 LAB — CBC
HCT: 34.5 % — ABNORMAL LOW (ref 36.0–46.0)
Hemoglobin: 11 g/dL — ABNORMAL LOW (ref 12.0–15.0)
MCHC: 31.8 g/dL (ref 30.0–36.0)
MCV: 85.8 fl (ref 78.0–100.0)
Platelets: 293 10*3/uL (ref 150.0–400.0)
RBC: 4.02 Mil/uL (ref 3.87–5.11)
RDW: 16 % — ABNORMAL HIGH (ref 11.5–15.5)
WBC: 6.3 10*3/uL (ref 4.0–10.5)

## 2020-10-31 LAB — VITAMIN B12: Vitamin B-12: 1155 pg/mL — ABNORMAL HIGH (ref 211–911)

## 2020-10-31 NOTE — Progress Notes (Signed)
OFFICE VISIT  10/31/2020  CC:  Chief Complaint  Patient presents with   Follow-up    Migraine,     HPI:    Patient is a 45 y.o. Caucasian female who presents for 1 month f/u migraine HAs and iron deficiency anemia. A/P as of last visit: "1) Migraine HAs, not well controlled.  Recent inc stress with divorce, new job, the move back to [place] all contributing some.  Discussed trial of prophylactic migraine med and she is agreeable to trial of topamax '25mg'$  bid.  Continue zomig as abortive med.   2) Adult ADD.  Stable on vyvanse '40mg'$  qd long term. CSC today. Vyvanse '40mg'$  qd, #30 eRx'd today.   3) Joint swelling (MCPs x 2 R hand), +FH RA in mother-->will check Rh factor   4) Health maintenance exam: Reviewed age and gender appropriate health maintenance issues (prudent diet, regular exercise, health risks of tobacco and excessive alcohol, use of seatbelts, fire alarms in home, use of sunscreen).  Also reviewed age and gender appropriate health screening as well as vaccine recommendations. Vaccines: UTD. Labs: fasting HP ordered. Cervical ca screening: hx of abnl pap and uterine polyps, got eval in [facility] 05/2020 and she'll be establishing with local GYN in near future for removal of polyps and repeat pap. Breast ca screening: She defers this until next cpe in 1 yr. Colon ca screening: average risk patient= as per latest guidelines, start screening at any time now->she wants to put this off until next CPE in 1 yr."  INTERIM HX:  Doing ok, no acute complaints. Her new job is not stressful at all.  IDA:  Labs last visit normal except Hb 10.4, MCV 84. Iron low.  Hemoccults neg x 3.   Started oral iron but she has been having too much GI upset from Fe SO4, tried taking 1/2 dose and tried qod dosing but still probs on this med.   "My periods are reduculous".  Regu interv, very heavy flow x 1 wk. Says has hx of mild low Iron and Hb chronically, waxes and wanes. Has GYN appt scheduled to establish  care soon.    Migraines:  she could not function on the topamax so I called in trial of inderal LA 80 qd. She didn't start this med b/c fear of side effects and potential of having to miss work and she can't take sick days b/c just started job recently.  Has needed zomig only 3 x the last month, probably d/t less stress from work lately.  ROS as above, plus--> no fevers, no CP, no SOB, no wheezing, no cough, no dizziness, no HAs, no rashes, no melena/hematochezia.  No polyuria or polydipsia.  No myalgias or arthralgias.  No focal weakness, paresthesias, or tremors.  No acute vision or hearing abnormalities.  No dysuria or unusual/new urinary urgency or frequency.  No recent changes in lower legs. No n/v/d or abd pain.  No palpitations.      Past Medical History:  Diagnosis Date   Adult ADHD    Allergy    Anemia    History of : ? No iron testing done?  Oral iron no help for Hb per pt.  Vit B12 helps (oral/otc) per pt.  Old record shows only one Hb from 2014 and it was normal.   Anxiety and depression    when going through separation was on citalopram temporarily.   Endometriosis    History of abnormal cervical Pap smear 2022   at GYN in  [place]   History of menorrhagia    Iron deficiency anemia 09/27/2020   +hx menorrhagia. Hemoccults neg x3, starting iron supp, plan set to recheck iron in 4-6 wks.   Migraine syndrome    Uterine leiomyoma    Uterine polyp 2022   at GYN in [facility]   Venous insufficiency of both lower extremities    low Na + compression hose.    Past Surgical History:  Procedure Laterality Date   BREAST LUMPECTOMY W/ NEEDLE LOCALIZATION     TONSILLECTOMY      Outpatient Medications Prior to Visit  Medication Sig Dispense Refill   cyanocobalamin 100 MCG tablet Take 500 mcg by mouth daily.     lisdexamfetamine (VYVANSE) 40 MG capsule Take 1 capsule (40 mg total) by mouth every morning. 30 capsule 0   zolmitriptan (ZOMIG) 5 MG tablet Take 1 tablet (5 mg total) by mouth as  needed for migraine. 4 tablet 6   ferrous sulfate 300 (60 Fe) MG/5ML syrup Take 5 mLs (300 mg total) by mouth daily. (Patient taking differently: Take 300 mg by mouth every other day.) 150 mL 5   propranolol ER (INDERAL LA) 80 MG 24 hr capsule Take 1 capsule (80 mg total) by mouth daily. 1 cap po every night (Patient not taking: Reported on 10/31/2020) 30 capsule 1   No facility-administered medications prior to visit.    Allergies  Allergen Reactions   Topiramate Other (See Comments)    "Can't function"    ROS As per HPI  PE: Vitals with BMI 10/31/2020 09/27/2020 12/02/2017  Height 5' 3.75" 5' 3.75" '5\' 5"'$   Weight 156 lbs 13 oz 156 lbs 6 oz 141 lbs 4 oz  BMI 27.13 [id number]  Systolic [id number]  Diastolic 84 75 72  Pulse 78 80 78   Gen: Alert, well appearing.  Patient is oriented to person, place, time, and situation. AFFECT: pleasant, lucid thought and speech. No pallor. CV: RRR, no m/r/g.   LUNGS: CTA bilat, nonlabored resps, good aeration in all lung fields. EXT: no clubbing or cyanosis.  no edema.    LABS:  Lab Results  Component Value Date   TSH 1.35 09/27/2020   Lab Results  Component Value Date   WBC 5.8 09/27/2020   HGB 10.4 (L) 09/27/2020   HCT 31.6 (L) 09/27/2020   MCV 83.8 09/27/2020   PLT 327.0 09/27/2020   Lab Results  Component Value Date   IRON 56 09/28/2020   FERRITIN 3.0 (L) 09/28/2020   Lab Results  Component Value Date   CREATININE 0.82 09/27/2020   BUN 17 09/27/2020   NA 140 09/27/2020   K 3.9 09/27/2020   CL 105 09/27/2020   CO2 27 09/27/2020   Lab Results  Component Value Date   ALT 18 09/27/2020   AST 17 09/27/2020   ALKPHOS 42 09/27/2020   BILITOT 0.5 09/27/2020   Lab Results  Component Value Date   CHOL 152 09/27/2020   Lab Results  Component Value Date   HDL 57.60 09/27/2020   Lab Results  Component Value Date   LDLCALC 89 09/27/2020   Lab Results  Component Value Date   TRIG 28.0 09/27/2020   Lab Results   Component Value Date   CHOLHDL 3 09/27/2020   Lab Results  Component Value Date   ESRSEDRATE 5 09/27/2020   Lab Results  Component Value Date   CRP <1.0 09/27/2020   Lab Results  Component Value Date   RF <14  09/28/2020   IMPRESSION AND PLAN:  1) IDA, d/t menorrhagia. Doesn't tolerate oral iron sulfate.  Rechecking labs today + vit B12. If iron and Hb still low as expected, I gave pt option of trying diff oral iron prep (ferrous gluconate) to see if more GI tolerable vs get iron infusion. She chooses iron infusion.  2) Migraine syndrome: less problematic of late.  Intol topamax. Pt wants to hold off on trial of any other prophylactic med at this time. Zomig costs her too much so she'll check with her formulary to see what triptan is preferred.  3) Adult ADD: doing well on vyvanse 40 qd. No new rx needed today. CSC UTD. Will do UDS at next f/u.  An After Visit Summary was printed and given to the patient.  FOLLOW UP: Return in about 6 months (around 05/03/2021) for routine chronic illness f/u.  Signed:  Crissie Sickles, MD           10/31/2020

## 2020-11-01 LAB — RETICULOCYTES
ABS Retic: 36810 cells/uL (ref 20000–80000)
Retic Ct Pct: 0.9 %

## 2020-11-01 LAB — IRON,TIBC AND FERRITIN PANEL
%SAT: 20 % (calc) (ref 16–45)
Ferritin: 3 ng/mL — ABNORMAL LOW (ref 16–232)
Iron: 73 ug/dL (ref 40–190)
TIBC: 369 mcg/dL (calc) (ref 250–450)

## 2020-11-09 ENCOUNTER — Encounter: Payer: Self-pay | Admitting: Family Medicine

## 2020-11-09 NOTE — Telephone Encounter (Signed)
I did the order 11/01/20 but it needs to be followed up to see why they have not contacted her to schedule-thx

## 2020-11-09 NOTE — Telephone Encounter (Signed)
Please advise 

## 2020-11-09 NOTE — Telephone Encounter (Signed)
Refaxed order to short stay

## 2020-11-10 ENCOUNTER — Encounter: Payer: Self-pay | Admitting: Family Medicine

## 2020-11-10 ENCOUNTER — Other Ambulatory Visit: Payer: Self-pay

## 2020-11-10 DIAGNOSIS — D509 Iron deficiency anemia, unspecified: Secondary | ICD-10-CM | POA: Insufficient documentation

## 2020-11-10 NOTE — Addendum Note (Signed)
Addended by: Henrene Dodge on: 11/10/2020 10:04 AM   Modules accepted: Orders

## 2020-11-11 ENCOUNTER — Other Ambulatory Visit: Payer: Self-pay

## 2020-11-11 NOTE — Telephone Encounter (Signed)
Mickel Baas called back and advised referral order entered yesterday was done incorrectly. Provider would need to speak with pharmacist, Gale Journey to do correct referral. Phone # [phone]

## 2020-11-11 NOTE — Telephone Encounter (Signed)
Patient would like advise for handling the headaches and fatigue, as mentioned below. Also, states she has not been contacted by the iron infusion clinic. Please call patient to advise.

## 2020-11-11 NOTE — Telephone Encounter (Signed)
Ordered placed via Epic. Office confirmed order

## 2020-11-15 ENCOUNTER — Telehealth: Payer: Self-pay | Admitting: Pharmacy Technician

## 2020-11-15 ENCOUNTER — Other Ambulatory Visit: Payer: Self-pay | Admitting: Pharmacy Technician

## 2020-11-15 ENCOUNTER — Other Ambulatory Visit: Payer: Self-pay | Admitting: Family Medicine

## 2020-11-15 DIAGNOSIS — D509 Iron deficiency anemia, unspecified: Secondary | ICD-10-CM

## 2020-11-15 MED ORDER — EPINEPHRINE 0.3 MG/0.3ML IJ SOAJ
0.3000 mg | Freq: Once | INTRAMUSCULAR | Status: DC | PRN
Start: 2020-11-15 — End: 2020-11-15

## 2020-11-15 MED ORDER — SODIUM CHLORIDE 0.9% FLUSH: 3.0000 mL | Freq: Once | INTRAVENOUS | Status: DC | PRN

## 2020-11-15 MED ORDER — SODIUM CHLORIDE 0.9 % IV SOLN: 510.0000 mg | Freq: Once | INTRAVENOUS | Status: DC

## 2020-11-15 MED ORDER — METHYLPREDNISOLONE SODIUM SUCC 125 MG IJ SOLR
125.0000 mg | Freq: Once | INTRAMUSCULAR | Status: DC | PRN
Start: 2020-11-15 — End: 2020-11-15

## 2020-11-15 MED ORDER — ANTICOAGULANT SODIUM CITRATE 4% (200MG/5ML) IV SOLN
5.0000 mL | Freq: Once | Status: DC | PRN
Start: 2020-11-15 — End: 2020-11-15

## 2020-11-15 MED ORDER — DIPHENHYDRAMINE HCL 50 MG/ML IJ SOLN
50.0000 mg | Freq: Once | INTRAMUSCULAR | Status: DC | PRN
Start: 2020-11-15 — End: 2020-11-15

## 2020-11-15 MED ORDER — ALBUTEROL SULFATE HFA 108 (90 BASE) MCG/ACT IN AERS: 2.0000 | INHALATION_SPRAY | Freq: Once | RESPIRATORY_TRACT | Status: DC | PRN

## 2020-11-15 MED ORDER — SODIUM CHLORIDE 0.9 % IV SOLN: 20.0000 mg | Freq: Once | INTRAVENOUS | Status: DC | PRN

## 2020-11-15 MED ORDER — SODIUM CHLORIDE 0.9% FLUSH: 10.0000 mL | Freq: Once | INTRAVENOUS | Status: DC | PRN

## 2020-11-15 MED ORDER — SODIUM CHLORIDE 0.9 % IV SOLN
Freq: Once | INTRAVENOUS | Status: DC | PRN
Start: 2020-11-15 — End: 2020-11-15

## 2020-11-15 MED ORDER — HEPARIN SOD (PORK) LOCK FLUSH 100 UNIT/ML IV SOLN: 250.0000 [IU] | Freq: Once | INTRAVENOUS | Status: DC | PRN

## 2020-11-15 MED ORDER — HEPARIN SOD (PORK) LOCK FLUSH 100 UNIT/ML IV SOLN: 500.0000 [IU] | Freq: Once | INTRAVENOUS | Status: DC | PRN

## 2020-11-15 MED ORDER — ALTEPLASE 2 MG IJ SOLR: 2.0000 mg | Freq: Once | INTRAMUSCULAR | Status: DC | PRN

## 2020-11-15 NOTE — Telephone Encounter (Addendum)
Dr. Kendra Opitz Submission: DENIED - Shirlean Kelly Payer: [employer] Medication & CPT/J Code(s) submitted: Feraheme (ferumoxytol) [phone] Route of submission (phone, fax, portal): PHONE Auth type: Buy/Bill  Denied due to patient have not tried and or failed step therapy. Venofer Ferrlicit Infed  No auth required for VENOFER REF# [id number]  Would you like to try Venofer?? Please advise??

## 2020-11-15 NOTE — Progress Notes (Signed)
A user error has taken place: encounter opened in error, closed for administrative reasons.

## 2020-11-16 ENCOUNTER — Telehealth: Payer: Self-pay | Admitting: Pharmacy Technician

## 2020-11-16 ENCOUNTER — Encounter: Payer: Self-pay | Admitting: Family Medicine

## 2020-11-16 NOTE — Telephone Encounter (Signed)
Thank you, we will enter the new order for Venofer.

## 2020-11-16 NOTE — Telephone Encounter (Signed)
Yes, venofer ok. Can you change the order or do I??

## 2020-11-17 ENCOUNTER — Encounter: Payer: Self-pay | Admitting: Family Medicine

## 2020-11-17 NOTE — Telephone Encounter (Signed)
error 

## 2020-11-24 ENCOUNTER — Other Ambulatory Visit: Payer: Self-pay | Admitting: Family Medicine

## 2020-11-24 MED ORDER — LISDEXAMFETAMINE DIMESYLATE 40 MG PO CAPS: 40.0000 mg | ORAL_CAPSULE | ORAL | 0 refills | Status: DC

## 2020-11-24 MED ORDER — LISDEXAMFETAMINE DIMESYLATE 40 MG PO CAPS: 40.0000 mg | ORAL_CAPSULE | ORAL | 0 refills | Status: AC

## 2020-11-24 NOTE — Telephone Encounter (Signed)
Requesting:vyvanse Contract:09/27/20 UDS:07/12/17 Last Visit:10/31/20 Next Visit:n/a Last Refill:10/25/20 (30,0  Please Advise

## 2020-11-24 NOTE — Telephone Encounter (Signed)
Vyvanse rx's for each of the ne 3 mo sent

## 2020-11-28 ENCOUNTER — Telehealth: Payer: Self-pay

## 2020-11-28 NOTE — Telephone Encounter (Signed)
Called to schedule Iron Infusion. No answer. Left a message. Thanks!

## 2020-12-05 ENCOUNTER — Ambulatory Visit (INDEPENDENT_AMBULATORY_CARE_PROVIDER_SITE_OTHER): Payer: 59

## 2020-12-05 ENCOUNTER — Other Ambulatory Visit: Payer: Self-pay

## 2020-12-05 VITALS — BP 100/63 | HR 76 | Temp 98.3°F | Resp 16 | Ht 63.0 in | Wt 157.0 lb

## 2020-12-05 DIAGNOSIS — D509 Iron deficiency anemia, unspecified: Secondary | ICD-10-CM

## 2020-12-05 MED ORDER — ACETAMINOPHEN 325 MG PO TABS
650.0000 mg | ORAL_TABLET | Freq: Once | ORAL | Status: AC
  Administered 2020-12-05: 650 mg via ORAL
  Filled 2020-12-05: qty 2

## 2020-12-05 MED ORDER — DIPHENHYDRAMINE HCL 25 MG PO CAPS: 50.0000 mg | ORAL_CAPSULE | Freq: Once | ORAL | Status: DC

## 2020-12-05 MED ORDER — METHYLPREDNISOLONE SODIUM SUCC 125 MG IJ SOLR: 125.0000 mg | Freq: Once | INTRAMUSCULAR | Status: DC | PRN

## 2020-12-05 MED ORDER — FAMOTIDINE IN NACL 20-0.9 MG/50ML-% IV SOLN: 20.0000 mg | Freq: Once | INTRAVENOUS | Status: DC | PRN

## 2020-12-05 MED ORDER — SODIUM CHLORIDE 0.9 % IV SOLN
200.0000 mg | Freq: Once | INTRAVENOUS | Status: AC
  Administered 2020-12-05: 200 mg via INTRAVENOUS
  Filled 2020-12-05: qty 10

## 2020-12-05 MED ORDER — DIPHENHYDRAMINE HCL 50 MG/ML IJ SOLN
50.0000 mg | Freq: Once | INTRAMUSCULAR | Status: AC | PRN
  Administered 2020-12-05: 25 mg via INTRAVENOUS
  Filled 2020-12-05: qty 1

## 2020-12-05 MED ORDER — SODIUM CHLORIDE 0.9 % IV SOLN: Freq: Once | INTRAVENOUS | Status: DC | PRN

## 2020-12-05 MED ORDER — EPINEPHRINE 0.3 MG/0.3ML IJ SOAJ: 0.3000 mg | Freq: Once | INTRAMUSCULAR | Status: DC | PRN

## 2020-12-05 MED ORDER — ALBUTEROL SULFATE HFA 108 (90 BASE) MCG/ACT IN AERS: 2.0000 | INHALATION_SPRAY | Freq: Once | RESPIRATORY_TRACT | Status: DC | PRN

## 2020-12-05 NOTE — Progress Notes (Addendum)
Diagnosis: Iron Deficiency Anemia  Provider:  Marshell Garfinkel, MD  Procedure: Infusion  IV Type: Peripheral, IV Location: R Antecubital  Venofer (Iron Sucrose), Dose: 200 mg  Infusion Start Time: 1415` 12/05/2020  Infusion Stop Time: 14.52 12/05/2020  Post Infusion IV Care:  30 minutes  Observation period completed and Peripheral IV Discontinued  Discharge: Condition: Good, Destination: Home . AVS provided to patient.   Performed by:Sabitri Ranabhat,RN

## 2020-12-07 ENCOUNTER — Other Ambulatory Visit: Payer: Self-pay

## 2020-12-07 ENCOUNTER — Ambulatory Visit (INDEPENDENT_AMBULATORY_CARE_PROVIDER_SITE_OTHER): Payer: 59 | Admitting: *Deleted

## 2020-12-07 ENCOUNTER — Encounter: Payer: Self-pay | Admitting: Family Medicine

## 2020-12-07 VITALS — BP 117/64 | HR 79 | Temp 98.2°F | Resp 16 | Wt 159.8 lb

## 2020-12-07 DIAGNOSIS — D509 Iron deficiency anemia, unspecified: Secondary | ICD-10-CM

## 2020-12-07 DIAGNOSIS — D500 Iron deficiency anemia secondary to blood loss (chronic): Secondary | ICD-10-CM

## 2020-12-07 MED ORDER — SODIUM CHLORIDE 0.9 % IV SOLN
200.0000 mg | Freq: Once | INTRAVENOUS | Status: AC
  Administered 2020-12-07: 200 mg via INTRAVENOUS
  Filled 2020-12-07: qty 10

## 2020-12-07 MED ORDER — DIPHENHYDRAMINE HCL 25 MG PO CAPS: 50.0000 mg | ORAL_CAPSULE | Freq: Once | ORAL | Status: DC

## 2020-12-07 MED ORDER — SODIUM CHLORIDE 0.9% FLUSH: 10.0000 mL | Freq: Once | INTRAVENOUS | Status: DC | PRN

## 2020-12-07 MED ORDER — ANTICOAGULANT SODIUM CITRATE 4% (200MG/5ML) IV SOLN: 5.0000 mL | Freq: Once | Status: DC | PRN

## 2020-12-07 MED ORDER — HEPARIN SOD (PORK) LOCK FLUSH 100 UNIT/ML IV SOLN: 250.0000 [IU] | Freq: Once | INTRAVENOUS | Status: DC | PRN

## 2020-12-07 MED ORDER — ACETAMINOPHEN 325 MG PO TABS: 650.0000 mg | ORAL_TABLET | Freq: Once | ORAL | Status: DC

## 2020-12-07 MED ORDER — METHYLPREDNISOLONE SODIUM SUCC 125 MG IJ SOLR: 125.0000 mg | Freq: Once | INTRAMUSCULAR | Status: DC | PRN

## 2020-12-07 MED ORDER — ALBUTEROL SULFATE HFA 108 (90 BASE) MCG/ACT IN AERS: 2.0000 | INHALATION_SPRAY | Freq: Once | RESPIRATORY_TRACT | Status: DC | PRN

## 2020-12-07 MED ORDER — FAMOTIDINE IN NACL 20-0.9 MG/50ML-% IV SOLN: 20.0000 mg | Freq: Once | INTRAVENOUS | Status: DC | PRN

## 2020-12-07 MED ORDER — EPINEPHRINE 0.3 MG/0.3ML IJ SOAJ: 0.3000 mg | Freq: Once | INTRAMUSCULAR | Status: DC | PRN

## 2020-12-07 MED ORDER — HEPARIN SOD (PORK) LOCK FLUSH 100 UNIT/ML IV SOLN: 500.0000 [IU] | Freq: Once | INTRAVENOUS | Status: DC | PRN

## 2020-12-07 MED ORDER — SODIUM CHLORIDE 0.9 % IV SOLN: Freq: Once | INTRAVENOUS | Status: DC | PRN

## 2020-12-07 MED ORDER — DIPHENHYDRAMINE HCL 50 MG/ML IJ SOLN: 50.0000 mg | Freq: Once | INTRAMUSCULAR | Status: DC | PRN

## 2020-12-07 MED ORDER — SODIUM CHLORIDE 0.9% FLUSH: 3.0000 mL | Freq: Once | INTRAVENOUS | Status: DC | PRN

## 2020-12-07 MED ORDER — ALTEPLASE 2 MG IJ SOLR: 2.0000 mg | Freq: Once | INTRAMUSCULAR | Status: DC | PRN

## 2020-12-07 NOTE — Progress Notes (Signed)
Diagnosis: Iron Deficiency Anemia  Provider:  Marshell Garfinkel, MD  Procedure: Infusion  IV Type: Peripheral, IV Location: R Antecubital  Venofer (Iron Sucrose), Dose: 200 mg  Infusion Start Time: 0052  Infusion Stop Time: 5910  Post Infusion IV Care: iv discontinued   Discharge: Condition: Good, Destination: Home . AVS provided to patient.   Performed by:  Ludwig Lean, RN

## 2020-12-07 NOTE — Telephone Encounter (Signed)
Nonfasting lab appt in 5-6 wks.  Labs have been ordered.

## 2020-12-07 NOTE — Telephone Encounter (Signed)
Please advise when pt should follow up for labs?

## 2020-12-09 ENCOUNTER — Other Ambulatory Visit: Payer: Self-pay

## 2020-12-09 ENCOUNTER — Ambulatory Visit: Payer: 59

## 2020-12-09 VITALS — BP 124/68 | HR 74 | Temp 98.0°F | Resp 16 | Wt 160.8 lb

## 2020-12-09 MED ORDER — EPINEPHRINE 0.3 MG/0.3ML IJ SOAJ: 0.3000 mg | Freq: Once | INTRAMUSCULAR | Status: DC | PRN

## 2020-12-09 MED ORDER — ALTEPLASE 2 MG IJ SOLR: 2.0000 mg | Freq: Once | INTRAMUSCULAR | Status: DC | PRN

## 2020-12-09 MED ORDER — ANTICOAGULANT SODIUM CITRATE 4% (200MG/5ML) IV SOLN
5.0000 mL | Freq: Once | Status: DC | PRN
  Filled 2020-12-09: qty 5

## 2020-12-09 MED ORDER — DIPHENHYDRAMINE HCL 50 MG/ML IJ SOLN: 50.0000 mg | Freq: Once | INTRAMUSCULAR | Status: DC | PRN

## 2020-12-09 MED ORDER — SODIUM CHLORIDE 0.9% FLUSH: 3.0000 mL | Freq: Once | INTRAVENOUS | Status: DC | PRN

## 2020-12-09 MED ORDER — ACETAMINOPHEN 325 MG PO TABS: 650.0000 mg | ORAL_TABLET | Freq: Once | ORAL | Status: DC

## 2020-12-09 MED ORDER — HEPARIN SOD (PORK) LOCK FLUSH 100 UNIT/ML IV SOLN: 250.0000 [IU] | Freq: Once | INTRAVENOUS | Status: DC | PRN

## 2020-12-09 MED ORDER — METHYLPREDNISOLONE SODIUM SUCC 125 MG IJ SOLR: 125.0000 mg | Freq: Once | INTRAMUSCULAR | Status: DC | PRN

## 2020-12-09 MED ORDER — SODIUM CHLORIDE 0.9 % IV SOLN
200.0000 mg | Freq: Once | INTRAVENOUS | Status: DC
  Filled 2020-12-09: qty 10

## 2020-12-09 MED ORDER — ALBUTEROL SULFATE HFA 108 (90 BASE) MCG/ACT IN AERS: 2.0000 | INHALATION_SPRAY | Freq: Once | RESPIRATORY_TRACT | Status: DC | PRN

## 2020-12-09 MED ORDER — HEPARIN SOD (PORK) LOCK FLUSH 100 UNIT/ML IV SOLN: 500.0000 [IU] | Freq: Once | INTRAVENOUS | Status: DC | PRN

## 2020-12-09 MED ORDER — SODIUM CHLORIDE 0.9 % IV SOLN: Freq: Once | INTRAVENOUS | Status: DC | PRN

## 2020-12-09 MED ORDER — DIPHENHYDRAMINE HCL 25 MG PO CAPS: 50.0000 mg | ORAL_CAPSULE | Freq: Once | ORAL | Status: DC

## 2020-12-09 MED ORDER — SODIUM CHLORIDE 0.9% FLUSH: 10.0000 mL | Freq: Once | INTRAVENOUS | Status: DC | PRN

## 2020-12-09 MED ORDER — FAMOTIDINE IN NACL 20-0.9 MG/50ML-% IV SOLN: 20.0000 mg | Freq: Once | INTRAVENOUS | Status: DC | PRN

## 2020-12-12 ENCOUNTER — Ambulatory Visit (INDEPENDENT_AMBULATORY_CARE_PROVIDER_SITE_OTHER): Payer: 59

## 2020-12-12 ENCOUNTER — Other Ambulatory Visit: Payer: Self-pay

## 2020-12-12 VITALS — BP 121/79 | HR 73 | Temp 98.0°F | Resp 18 | Ht 63.0 in | Wt 162.0 lb

## 2020-12-12 DIAGNOSIS — D509 Iron deficiency anemia, unspecified: Secondary | ICD-10-CM | POA: Diagnosis not present

## 2020-12-12 MED ORDER — DIPHENHYDRAMINE HCL 25 MG PO CAPS: 50.0000 mg | ORAL_CAPSULE | Freq: Once | ORAL | Status: DC

## 2020-12-12 MED ORDER — SODIUM CHLORIDE 0.9 % IV SOLN
200.0000 mg | Freq: Once | INTRAVENOUS | Status: AC
  Administered 2020-12-12: 200 mg via INTRAVENOUS
  Filled 2020-12-12: qty 10

## 2020-12-12 MED ORDER — ACETAMINOPHEN 325 MG PO TABS
650.0000 mg | ORAL_TABLET | Freq: Once | ORAL | Status: AC
  Administered 2020-12-12: 650 mg via ORAL
  Filled 2020-12-12: qty 2

## 2020-12-12 NOTE — Progress Notes (Signed)
Diagnosis: Iron Deficiency Anemia  Provider:  Marshell Garfinkel, MD  Procedure: Infusion  IV Type: Peripheral, IV Location: L Forearm  Venofer (Iron Sucrose), Dose: 200 mg  Infusion Start Time: 10.18 12/12/2020  Infusion Stop Time: 11.3 12/12/2020  Post Infusion IV Care: Pt is hard stick. Peripheral IV left in place for next infusion on 9/28. Instruction given pt. Verbalize understanding.   Discharge: Condition: Good, Destination: Home . AVS provided to patient.   Performed by:  Arnoldo Morale, RN

## 2020-12-14 ENCOUNTER — Ambulatory Visit: Payer: 59

## 2020-12-14 ENCOUNTER — Other Ambulatory Visit: Payer: Self-pay

## 2020-12-14 VITALS — BP 126/86 | HR 71 | Temp 98.5°F | Resp 18

## 2020-12-14 DIAGNOSIS — D509 Iron deficiency anemia, unspecified: Secondary | ICD-10-CM

## 2020-12-14 MED ORDER — DIPHENHYDRAMINE HCL 25 MG PO CAPS: 50.0000 mg | ORAL_CAPSULE | Freq: Once | ORAL | Status: DC

## 2020-12-14 MED ORDER — SODIUM CHLORIDE 0.9 % IV SOLN
200.0000 mg | Freq: Once | INTRAVENOUS | Status: AC
  Administered 2020-12-14: 200 mg via INTRAVENOUS
  Filled 2020-12-14: qty 10

## 2020-12-14 MED ORDER — ACETAMINOPHEN 325 MG PO TABS: 650.0000 mg | ORAL_TABLET | Freq: Once | ORAL | Status: DC

## 2020-12-14 NOTE — Progress Notes (Signed)
Diagnosis: Iron Deficiency Anemia  Provider:  Marshell Garfinkel, MD  Procedure: Infusion  IV Type: Peripheral, IV Location: L Upper Arm  Venofer (Iron Sucrose), Dose: 200 mg  Infusion Start Time: 5625  Infusion Stop Time: 1500  Post Infusion IV Care: Peripheral IV Discontinued  Discharge: Condition: Good, Destination: Home . AVS provided to patient.   Performed by:  Charlie Pitter, RN

## 2020-12-14 NOTE — Progress Notes (Addendum)
Medication not administered with this visit due to V infiltration. Patient has required multiple sticks each visit.  Patient agreeable to coming back on Friday to try again as well as to taking oral medications and checking labs if that is approved by the MD.  Message sent to Dr. Anitra Lauth regarding same.

## 2020-12-16 ENCOUNTER — Encounter: Payer: Self-pay | Admitting: Family Medicine

## 2020-12-16 ENCOUNTER — Ambulatory Visit: Payer: 59

## 2020-12-24 ENCOUNTER — Other Ambulatory Visit: Payer: Self-pay | Admitting: Family Medicine

## 2021-01-24 ENCOUNTER — Other Ambulatory Visit: Payer: Self-pay | Admitting: Obstetrics and Gynecology

## 2021-03-17 ENCOUNTER — Other Ambulatory Visit: Payer: Self-pay

## 2021-03-17 ENCOUNTER — Encounter (HOSPITAL_BASED_OUTPATIENT_CLINIC_OR_DEPARTMENT_OTHER): Payer: Self-pay | Admitting: Obstetrics and Gynecology

## 2021-03-17 NOTE — Progress Notes (Signed)
Spoke w/ via phone for pre-op interview---Carol Beasley needs dos----   UPT,CBC            Beasley results------ COVID test -----patient states asymptomatic no test needed Arrive at -------0645 NPO after MN NO Solid Food.  Clear liquids from MN until--- Med rec completed Medications to take morning of surgery ----- Vyvanse and Zomig PRN Diabetic medication ----- Patient instructed no nail polish to be worn day of surgery Patient instructed to bring photo id and insurance card day of surgery Patient aware to have Driver (ride ) / caregiver patient aware she has to have driver and caregiver for 24 hours.    for 24 hours after surgery  Patient Special Instructions ----- Pre-Op special Istructions ----- Patient verbalized understanding of instructions that were given at this phone interview. Patient denies shortness of breath, chest pain, fever, cough at this phone interview.

## 2021-03-23 NOTE — Anesthesia Preprocedure Evaluation (Addendum)
Anesthesia Evaluation  Patient identified by MRN, date of birth, ID band Patient awake    Reviewed: Allergy & Precautions, NPO status , Patient's Chart, lab work & pertinent test results  History of Anesthesia Complications Negative for: history of anesthetic complications  Airway Mallampati: II  TM Distance: >3 FB Neck ROM: Full    Dental no notable dental hx.    Pulmonary neg pulmonary ROS,    Pulmonary exam normal        Cardiovascular negative cardio ROS Normal cardiovascular exam     Neuro/Psych  Headaches, Anxiety Depression    GI/Hepatic negative GI ROS, Neg liver ROS,   Endo/Other  negative endocrine ROS  Renal/GU negative Renal ROS  negative genitourinary   Musculoskeletal negative musculoskeletal ROS (+)   Abdominal   Peds  Hematology  (+) anemia , Hgb 11.0   Anesthesia Other Findings Day of surgery medications reviewed with patient.  Reproductive/Obstetrics menorrhagia, endometrial polyps                            Anesthesia Physical Anesthesia Plan  ASA: 2  Anesthesia Plan: General   Post-op Pain Management: Tylenol PO (pre-op) and Toradol IV (intra-op)   Induction: Intravenous  PONV Risk Score and Plan: 3 and Treatment may vary due to age or medical condition, Ondansetron, Dexamethasone, Midazolam and Scopolamine patch - Pre-op  Airway Management Planned: LMA  Additional Equipment: None  Intra-op Plan:   Post-operative Plan: Extubation in OR  Informed Consent: I have reviewed the patients History and Physical, chart, labs and discussed the procedure including the risks, benefits and alternatives for the proposed anesthesia with the patient or authorized representative who has indicated his/her understanding and acceptance.     Dental advisory given  Plan Discussed with: CRNA  Anesthesia Plan Comments:        Anesthesia Quick Evaluation

## 2021-03-24 ENCOUNTER — Ambulatory Visit (HOSPITAL_BASED_OUTPATIENT_CLINIC_OR_DEPARTMENT_OTHER): Payer: 59 | Admitting: Anesthesiology

## 2021-03-24 ENCOUNTER — Encounter (HOSPITAL_BASED_OUTPATIENT_CLINIC_OR_DEPARTMENT_OTHER): Payer: Self-pay | Admitting: Obstetrics and Gynecology

## 2021-03-24 ENCOUNTER — Encounter (HOSPITAL_BASED_OUTPATIENT_CLINIC_OR_DEPARTMENT_OTHER)
Admission: RE | Disposition: A | Discharge status: Home / Self Care | Payer: Self-pay | Source: Ambulatory Visit | Attending: Obstetrics and Gynecology

## 2021-03-24 ENCOUNTER — Ambulatory Visit (HOSPITAL_BASED_OUTPATIENT_CLINIC_OR_DEPARTMENT_OTHER)
Admission: RE | Admit: 2021-03-24 | Discharge: 2021-03-24 | Disposition: A | Discharge status: Home / Self Care | Payer: 59 | Source: Ambulatory Visit | Attending: Obstetrics and Gynecology | Admitting: Obstetrics and Gynecology

## 2021-03-24 ENCOUNTER — Other Ambulatory Visit: Payer: Self-pay

## 2021-03-24 DIAGNOSIS — N840 Polyp of corpus uteri: Secondary | ICD-10-CM | POA: Diagnosis not present

## 2021-03-24 DIAGNOSIS — N920 Excessive and frequent menstruation with regular cycle: Secondary | ICD-10-CM | POA: Insufficient documentation

## 2021-03-24 HISTORY — PX: DILATATION & CURETTAGE/HYSTEROSCOPY WITH MYOSURE: SHX6511

## 2021-03-24 HISTORY — PX: ENDOMETRIAL ABLATION: SHX621

## 2021-03-24 LAB — CBC
HCT: 43 % (ref 36.0–46.0)
Hemoglobin: 14.1 g/dL (ref 12.0–15.0)
MCH: 31.1 pg (ref 26.0–34.0)
MCHC: 32.8 g/dL (ref 30.0–36.0)
MCV: 94.7 fL (ref 80.0–100.0)
Platelets: 289 10*3/uL (ref 150–400)
RBC: 4.54 MIL/uL (ref 3.87–5.11)
RDW: 13.2 % (ref 11.5–15.5)
WBC: 9.2 10*3/uL (ref 4.0–10.5)
nRBC: 0 % (ref 0.0–0.2)

## 2021-03-24 LAB — POCT PREGNANCY, URINE: Preg Test, Ur: NEGATIVE

## 2021-03-24 SURGERY — DILATATION & CURETTAGE/HYSTEROSCOPY WITH MYOSURE
Anesthesia: General | Site: Uterus

## 2021-03-24 MED ORDER — KETOROLAC TROMETHAMINE 30 MG/ML IJ SOLN
INTRAMUSCULAR | Status: DC | PRN
  Administered 2021-03-24: 30 mg via INTRAVENOUS

## 2021-03-24 MED ORDER — DEXAMETHASONE SODIUM PHOSPHATE 10 MG/ML IJ SOLN
INTRAMUSCULAR | Status: DC | PRN
Start: 2021-03-24 — End: 2021-03-24
  Administered 2021-03-24: 10 mg via INTRAVENOUS

## 2021-03-24 MED ORDER — SODIUM CHLORIDE 0.9 % IR SOLN
Status: DC | PRN
  Administered 2021-03-24: 3000 mL

## 2021-03-24 MED ORDER — ACETAMINOPHEN 10 MG/ML IV SOLN
1000.0000 mg | Freq: Once | INTRAVENOUS | Status: AC
  Administered 2021-03-24: 1000 mg via INTRAVENOUS

## 2021-03-24 MED ORDER — FENTANYL CITRATE (PF) 100 MCG/2ML IJ SOLN
25.0000 ug | INTRAMUSCULAR | Status: DC | PRN
  Administered 2021-03-24 (×2): 25 ug via INTRAVENOUS

## 2021-03-24 MED ORDER — DEXAMETHASONE SODIUM PHOSPHATE 10 MG/ML IJ SOLN
INTRAMUSCULAR | Status: AC
  Filled 2021-03-24: qty 1

## 2021-03-24 MED ORDER — ACETAMINOPHEN 500 MG PO TABS: 1000.0000 mg | ORAL_TABLET | Freq: Once | ORAL | Status: DC

## 2021-03-24 MED ORDER — LACTATED RINGERS IV SOLN
INTRAVENOUS | Status: DC
  Administered 2021-03-24: 1000 mL via INTRAVENOUS

## 2021-03-24 MED ORDER — WHITE PETROLATUM EX OINT
TOPICAL_OINTMENT | CUTANEOUS | Status: AC
  Filled 2021-03-24: qty 5

## 2021-03-24 MED ORDER — PHENYLEPHRINE 40 MCG/ML (10ML) SYRINGE FOR IV PUSH (FOR BLOOD PRESSURE SUPPORT)
PREFILLED_SYRINGE | INTRAVENOUS | Status: AC
  Filled 2021-03-24: qty 10

## 2021-03-24 MED ORDER — HYDROCODONE-ACETAMINOPHEN 5-325 MG PO TABS: 1.0000 | ORAL_TABLET | Freq: Four times a day (QID) | ORAL | 0 refills | Status: AC | PRN

## 2021-03-24 MED ORDER — OXYCODONE HCL 5 MG PO TABS: 5.0000 mg | ORAL_TABLET | Freq: Once | ORAL | Status: DC | PRN

## 2021-03-24 MED ORDER — LIDOCAINE 2% (20 MG/ML) 5 ML SYRINGE
INTRAMUSCULAR | Status: DC | PRN
  Administered 2021-03-24: 80 mg via INTRAVENOUS

## 2021-03-24 MED ORDER — FENTANYL CITRATE (PF) 100 MCG/2ML IJ SOLN
INTRAMUSCULAR | Status: AC
  Filled 2021-03-24: qty 2

## 2021-03-24 MED ORDER — ONDANSETRON HCL 4 MG/2ML IJ SOLN
INTRAMUSCULAR | Status: AC
  Filled 2021-03-24: qty 2

## 2021-03-24 MED ORDER — LIDOCAINE 2% (20 MG/ML) 5 ML SYRINGE
INTRAMUSCULAR | Status: AC
  Filled 2021-03-24: qty 5

## 2021-03-24 MED ORDER — ONDANSETRON HCL 4 MG/2ML IJ SOLN
INTRAMUSCULAR | Status: DC | PRN
  Administered 2021-03-24: 4 mg via INTRAVENOUS

## 2021-03-24 MED ORDER — PROPOFOL 10 MG/ML IV BOLUS
INTRAVENOUS | Status: AC
  Filled 2021-03-24: qty 20

## 2021-03-24 MED ORDER — PROPOFOL 10 MG/ML IV BOLUS
INTRAVENOUS | Status: DC | PRN
  Administered 2021-03-24: 140 mg via INTRAVENOUS

## 2021-03-24 MED ORDER — ACETAMINOPHEN 10 MG/ML IV SOLN
INTRAVENOUS | Status: AC
  Filled 2021-03-24: qty 100

## 2021-03-24 MED ORDER — PHENYLEPHRINE 40 MCG/ML (10ML) SYRINGE FOR IV PUSH (FOR BLOOD PRESSURE SUPPORT)
PREFILLED_SYRINGE | INTRAVENOUS | Status: DC | PRN
  Administered 2021-03-24: 120 ug via INTRAVENOUS
  Administered 2021-03-24 (×3): 80 ug via INTRAVENOUS

## 2021-03-24 MED ORDER — FENTANYL CITRATE (PF) 100 MCG/2ML IJ SOLN
INTRAMUSCULAR | Status: DC | PRN
  Administered 2021-03-24: 100 ug via INTRAVENOUS

## 2021-03-24 MED ORDER — MIDAZOLAM HCL 2 MG/2ML IJ SOLN
INTRAMUSCULAR | Status: AC
  Filled 2021-03-24: qty 2

## 2021-03-24 MED ORDER — OXYCODONE HCL 5 MG/5ML PO SOLN: 5.0000 mg | Freq: Once | ORAL | Status: DC | PRN

## 2021-03-24 MED ORDER — PROMETHAZINE HCL 25 MG/ML IJ SOLN: 6.2500 mg | INTRAMUSCULAR | Status: DC | PRN

## 2021-03-24 MED ORDER — MIDAZOLAM HCL 5 MG/5ML IJ SOLN
INTRAMUSCULAR | Status: DC | PRN
  Administered 2021-03-24: 2 mg via INTRAVENOUS

## 2021-03-24 MED ORDER — IBUPROFEN 800 MG PO TABS
800.0000 mg | ORAL_TABLET | Freq: Three times a day (TID) | ORAL | 11 refills | Status: AC | PRN
Start: 2021-03-24 — End: ?

## 2021-03-24 MED ORDER — SCOPOLAMINE 1 MG/3DAYS TD PT72: 1.0000 | MEDICATED_PATCH | Freq: Once | TRANSDERMAL | Status: DC

## 2021-03-24 SURGICAL SUPPLY — 27 items
ABLATOR SURESOUND NOVASURE (ABLATOR) ×1 IMPLANT
BIPOLAR CUTTING LOOP 21FR (ELECTRODE)
CATH ROBINSON RED A/P 16FR (CATHETERS) IMPLANT
DEVICE MYOSURE LITE (MISCELLANEOUS) IMPLANT
DEVICE MYOSURE REACH (MISCELLANEOUS) ×1 IMPLANT
DILATOR CANAL MILEX (MISCELLANEOUS) IMPLANT
DRSG TELFA 3X8 NADH (GAUZE/BANDAGES/DRESSINGS) ×2 IMPLANT
GAUZE 4X4 16PLY ~~LOC~~+RFID DBL (SPONGE) ×5 IMPLANT
GAUZE PETROLATUM 1 X8 (GAUZE/BANDAGES/DRESSINGS) IMPLANT
GAUZE VASELINE 3X9 (GAUZE/BANDAGES/DRESSINGS) ×1 IMPLANT
GLOVE SURG LTX SZ6.5 (GLOVE) ×3 IMPLANT
GLOVE SURG UNDER POLY LF SZ7 (GLOVE) ×3 IMPLANT
GOWN STRL REUS W/TWL LRG LVL3 (GOWN DISPOSABLE) ×3 IMPLANT
IV NS IRRIG 3000ML ARTHROMATIC (IV SOLUTION) ×3 IMPLANT
KIT PROCEDURE FLUENT (KITS) ×3 IMPLANT
KIT TURNOVER CYSTO (KITS) ×3 IMPLANT
LOOP CUTTING BIPOLAR 21FR (ELECTRODE) IMPLANT
MYOSURE XL FIBROID (MISCELLANEOUS)
PACK VAGINAL MINOR WOMEN LF (CUSTOM PROCEDURE TRAY) ×3 IMPLANT
PAD DRESSING TELFA 3X8 NADH (GAUZE/BANDAGES/DRESSINGS) ×2 IMPLANT
PAD OB MATERNITY 4.3X12.25 (PERSONAL CARE ITEMS) ×3 IMPLANT
PAD PREP 24X48 CUFFED NSTRL (MISCELLANEOUS) ×3 IMPLANT
SEAL CERVICAL OMNI LOK (ABLATOR) IMPLANT
SEAL ROD LENS SCOPE MYOSURE (ABLATOR) ×6 IMPLANT
SYSTEM TISS REMOVAL MYOSURE XL (MISCELLANEOUS) IMPLANT
TOWEL OR 17X26 10 PK STRL BLUE (TOWEL DISPOSABLE) ×3 IMPLANT
WATER STERILE IRR 500ML POUR (IV SOLUTION) ×3 IMPLANT

## 2021-03-24 NOTE — Discharge Instructions (Signed)

## 2021-03-24 NOTE — Op Note (Signed)
NAME: Carol Beasley, Carol Beasley MEDICAL RECORD NO: [id number] ACCOUNT NO: [id number] DATE OF BIRTH: 10-08-1975 FACILITY: [facility] LOCATION: [facility] PHYSICIAN: Jacquel Redditt A. Garwin Brothers, MD  Operative Report   DATE OF PROCEDURE: 03/24/2021  PREOPERATIVE DIAGNOSES:  Menorrhagia with regular cycles, endometrial polyp.  PROCEDURES: Diagnostic hysteroscopy, hysteroscopic resection of endometrial polyps, Dilation and Curettage, NovaSure endometrial ablation  POSTOPERATIVE DIAGNOSES:  Menorrhagia with regular cycles and endometrial polyps.  ANESTHESIA:  General.  SURGEON:  Servando Salina, MD  ASSISTANT:  None.  DESCRIPTION OF PROCEDURE:  Under adequate general anesthesia, the patient was placed in dorsal lithotomy position.  She was sterilely prepped and draped in the usual fashion.  The patient had voided prior to entering the room.  Therefore, she was not  catheterized.  Examination under anesthesia revealed the patient with actively heavy bleeding.  Uterus was bulky, anteverted.  No adnexal masses could be appreciated.  Bivalve speculum was placed in the vagina.  The patient had an old cervical laceration  at the 1 o'clock position.  The cervix was already dilated.  A single tooth tenaculum was placed on the anterior lip of the cervix.  The diagnostic hysteroscope was introduced into the uterine cavity. However, a large amount of fluid was back flowing  and therefore, the 2 tenaculum was placed tangentially on the cervix to try to close off the parous os. Once this was done, the cavity was better visualized, with large amount of endometrial thickening, polyps, polypoid lesions.  Both tubal ostia could  be seen.  At that point, using the Reach resectoscope, the endometrial cavity was resected.  The hysteroscope was removed.  The NovaSure endometrial ablative apparatus was then inserted.  The endocervical canal had sounded to 3.5 cm and the uterus was  sounded to 10 cm and initial cavity length of 6.5  for the uterine length was set; however, the endometrial ablation apparatus would not deploy outside  of the red zone.  It was then decreased to 6.0 cm and the wand was able to achieve a width of 4.7 cm. With that and a  power of 155, the cavity was ablated over 1 minute and 36 seconds.  The instruments were then removed.  The diagnostic hysteroscope was then reinserted and the endometrial cavity was inspected.  Good endometrial ablation was noted throughout.  The  procedure was then terminated by removing all instruments from the vagina.  Specimen labeled endometrial polyp with endometrial curetting was sent to pathology.  Estimated blood loss was about 25 mL for the procedure.  Fluid deficit was 175 mL.  Complication was none.  The patient tolerated the procedure well and was transferred to recovery room in stable condition.   SHW D: 03/24/2021 9:03:36 am T: 03/24/2021 9:31:00 am  JOB: [id number]/ [id number]

## 2021-03-24 NOTE — Brief Op Note (Signed)
03/24/2021  8:57 AM  PATIENT:  Arville Care  46 y.o. female  PRE-OPERATIVE DIAGNOSIS:  menorrhagia, endometrial polyps  POST-OPERATIVE DIAGNOSIS:  menorrhagia, endometrial polyps  PROCEDURE:  diagnostic hysteroscopy, hysteroscopic resection of endometrial polyps, dilation and curettage, novasure endometrial ablation  SURGEON:  Surgeon(s) and Role:    * Servando Salina, MD - Primary  PHYSICIAN ASSISTANT:   ASSISTANTS: none   ANESTHESIA:   general Findings: thickened endometrium with multiple polyps, both tubal ostia seen EBL:  25 mL   BLOOD ADMINISTERED:none  DRAINS: none   LOCAL MEDICATIONS USED:  NONE  SPECIMEN:  Source of Specimen:  EMC with polyps  DISPOSITION OF SPECIMEN:  PATHOLOGY  COUNTS:  YES  TOURNIQUET:  * No tourniquets in log *  DICTATION: .Other Dictation: Dictation Number [id number]  PLAN OF CARE: Discharge to home after PACU  PATIENT DISPOSITION:  PACU - hemodynamically stable.   Delay start of Pharmacological VTE agent (>24hrs) due to surgical blood loss or risk of bleeding: no

## 2021-03-24 NOTE — Anesthesia Postprocedure Evaluation (Signed)
Anesthesia Post Note  Patient: Carol Beasley  Procedure(s) Performed: DILATATION & CURETTAGE/HYSTEROSCOPY WITH MYOSURE (Uterus) ENDOMETRIAL ABLATION (Uterus)     Patient location during evaluation: PACU Anesthesia Type: General Level of consciousness: awake and alert and oriented Pain management: pain level controlled Vital Signs Assessment: post-procedure vital signs reviewed and stable Respiratory status: spontaneous breathing, nonlabored ventilation and respiratory function stable Cardiovascular status: blood pressure returned to baseline Postop Assessment: no apparent nausea or vomiting Anesthetic complications: no   No notable events documented.  Last Vitals:  Vitals:   03/24/21 0930 03/24/21 1000  BP: 114/74 121/73  Pulse: 62 64  Resp: 17 16  Temp:  (!) 36.4 C  SpO2: 98% 99%    Last Pain:  Vitals:   03/24/21 1000  TempSrc:   PainSc: [place]

## 2021-03-24 NOTE — Interval H&P Note (Signed)
History and Physical Interval Note:  03/24/2021 7:35 AM  Carol Beasley  has presented today for surgery, with the diagnosis of menorrhagia, endometrial polyps.  The various methods of treatment have been discussed with the patient and family. After consideration of risks, benefits and other options for treatment, the patient has consented to  PROCEDURE: diagnostic hysteroscopy, endometrial ablation, hysteroscopic resection of endometrial polyp, dilation and curettage as a surgical intervention.  The patient's history has been reviewed, patient examined, no change in status, stable for surgery.  I have reviewed the patient's chart and labs.  Questions were answered to the patient's satisfaction.     Kadia Abaya A Najir Roop

## 2021-03-24 NOTE — H&P (Signed)
Carol Beasley is an 46 y.o. female. G38P4 DF presents for surgical management of menorrhagia , endometrial polyp. Pt is scheduled for dx hysteroscopy, hysteroscopic removal of endometrial polyp, novasure endometrial ablation  Pertinent Gynecological History: Menses: flow is excessive with use of 6 pads or tampons on heaviest days Bleeding: menorrhagia Contraception: none DES exposure: denies Blood transfusions: none Sexually transmitted diseases: no past history Previous GYN Procedures:  dx laparoscopy , D&C, hysteroscopy  Last mammogram: normal Date: 2022 Last pap: abnormal: (+) HPV  Date: 2022 OB History: G4, P4   Menstrual History: Menarche age: n/a No LMP recorded.    Past Medical History:  Diagnosis Date   Adult ADHD    Allergy    Anemia    History of : ? No iron testing done?  Oral iron no help for Hb per pt.  Vit B12 helps (oral/otc) per pt.  Old record shows only one Hb from 2014 and it was normal.   Anxiety and depression    when going through separation was on citalopram temporarily.   Endometriosis    History of abnormal cervical Pap smear 2022   at GYN in [facility]   History of menorrhagia    Iron deficiency anemia 09/27/2020   +hx menorrhagia. Hemoccults neg x3, starting iron supp, plan set to recheck iron in 4-6 wks.   Migraine syndrome    Uterine leiomyoma    Uterine polyp 2022   at GYN in [facility]   Venous insufficiency of both lower extremities    low Na + compression hose.    Past Surgical History:  Procedure Laterality Date   BREAST LUMPECTOMY W/ NEEDLE LOCALIZATION     DILATION AND CURETTAGE OF UTERUS N/A    1997 and 2002   TONSILLECTOMY      Family History  Problem Relation Age of Onset   Cancer Mother        Ovarian   Hypertension Mother    Irritable bowel syndrome Mother    Crohn's disease Mother    Rheum arthritis Mother    COPD Father    Hypertension Sister    Hypertension Brother     Social History:  reports that she has never smoked. She  has never used smokeless tobacco. She reports that she does not drink alcohol and does not use drugs.  Allergies:  Allergies  Allergen Reactions   Topiramate Other (See Comments)    "Can't function"    No medications prior to admission.    Review of Systems  All other systems reviewed and are negative.  Height 5\' 3"  (1.6 m), weight 67.1 kg. Physical Exam Constitutional:      Appearance: Normal appearance.  HENT:     Left Ear: Tympanic membrane normal.     Mouth/Throat:     Mouth: Mucous membranes are moist.  Eyes:     Extraocular Movements: Extraocular movements intact.  Cardiovascular:     Heart sounds: Normal heart sounds.  Pulmonary:     Breath sounds: Normal breath sounds.  Abdominal:     Palpations: Abdomen is soft.  Genitourinary:    General: Normal vulva.     Comments: Vagina nl Cervix parous Uterus bulky Adnexa nl Musculoskeletal:        General: Normal range of motion.     Cervical back: Neck supple.  Skin:    General: Skin is warm and dry.  Neurological:     Mental Status: She is alert and oriented to person, place, and time.  Psychiatric:  Mood and Affect: Mood normal.        Behavior: Behavior normal.    No results found for this or any previous visit (from the past 24 hour(s)).  No results found.  Assessment/Plan: Menorrhagia with regular cycle Endometrial polyp P) dx hysteroscopy, hysteroscopic resection of endometrial polyp,endometrial ablation Risk of surgery reviewed including infection, bleeding, injury to surrounding organ structures, uterine perforation( 03/998) and its risk, thermal injury, fluid overload and its mgmt, poss need for blood transfusion and its risk( HIV, acute rxn,hepatitis). All ? answered  Adelis Docter A Arnol Mcgibbon 03/24/2021, 5:24 AM

## 2021-03-24 NOTE — Transfer of Care (Signed)
Immediate Anesthesia Transfer of Care Note  Patient: Carol Beasley  Procedure(s) Performed: DILATATION & CURETTAGE/HYSTEROSCOPY WITH MYOSURE (Uterus) ENDOMETRIAL ABLATION (Uterus)  Patient Location: PACU  Anesthesia Type:General  Level of Consciousness: awake, alert , oriented and patient cooperative  Airway & Oxygen Therapy: Patient Spontanous Breathing  Post-op Assessment: Report given to RN and Post -op Vital signs reviewed and stable  Post vital signs: Reviewed and stable  Last Vitals:  Vitals Value Taken Time  BP 130/80 03/24/21 0837  Temp    Pulse 65 03/24/21 0837  Resp 15 03/24/21 0838  SpO2 100 % 03/24/21 0837  Vitals shown include unvalidated device data.  Last Pain:  Vitals:   03/24/21 0622  TempSrc:   PainSc: 3       Patients Stated Pain Goal: 8 (59/56/38 7564)  Complications: No notable events documented.

## 2021-03-24 NOTE — Anesthesia Procedure Notes (Signed)
Procedure Name: LMA Insertion Date/Time: 03/24/2021 7:45 AM Performed by: Rogers Blocker, CRNA Pre-anesthesia Checklist: Patient identified, Emergency Drugs available, Suction available and Patient being monitored Patient Re-evaluated:Patient Re-evaluated prior to induction Oxygen Delivery Method: Circle System Utilized Preoxygenation: Pre-oxygenation with 100% oxygen Induction Type: IV induction Ventilation: Mask ventilation without difficulty LMA: LMA inserted LMA Size: 4.0 Number of attempts: 1 Airway Equipment and Method: Bite block Placement Confirmation: positive ETCO2 Tube secured with: Tape Dental Injury: Teeth and Oropharynx as per pre-operative assessment

## 2021-03-27 ENCOUNTER — Encounter (HOSPITAL_BASED_OUTPATIENT_CLINIC_OR_DEPARTMENT_OTHER): Payer: Self-pay | Admitting: Obstetrics and Gynecology

## 2021-03-28 LAB — SURGICAL PATHOLOGY

## 2021-05-29 ENCOUNTER — Ambulatory Visit (HOSPITAL_BASED_OUTPATIENT_CLINIC_OR_DEPARTMENT_OTHER)
Admission: RE | Admit: 2021-05-29 | Discharge: 2021-05-29 | Disposition: A | Discharge status: Home / Self Care | Payer: 59 | Source: Ambulatory Visit | Attending: Family Medicine | Admitting: Family Medicine

## 2021-05-29 ENCOUNTER — Ambulatory Visit: Payer: Self-pay

## 2021-05-29 ENCOUNTER — Telehealth: Payer: Self-pay | Admitting: Family Medicine

## 2021-05-29 ENCOUNTER — Other Ambulatory Visit: Payer: Self-pay

## 2021-05-29 ENCOUNTER — Ambulatory Visit (INDEPENDENT_AMBULATORY_CARE_PROVIDER_SITE_OTHER): Payer: 59 | Admitting: Family Medicine

## 2021-05-29 VITALS — BP 136/80 | Ht 63.0 in | Wt 154.0 lb

## 2021-05-29 DIAGNOSIS — M25551 Pain in right hip: Secondary | ICD-10-CM | POA: Diagnosis present

## 2021-05-29 DIAGNOSIS — M533 Sacrococcygeal disorders, not elsewhere classified: Secondary | ICD-10-CM | POA: Diagnosis present

## 2021-05-29 MED ORDER — TRIAMCINOLONE ACETONIDE 40 MG/ML IJ SUSP
40.0000 mg | Freq: Once | INTRAMUSCULAR | Status: AC
  Administered 2021-05-29: 40 mg via INTRA_ARTICULAR

## 2021-05-29 NOTE — Assessment & Plan Note (Signed)
Acutely occurring.  Does have pain over the SI joint with radicular type pain. ?-Counseled on home exercise therapy and supportive care. ?-Injection today. ?-Could consider further imaging or physical therapy. ?

## 2021-05-29 NOTE — Progress Notes (Signed)
?Carol Beasley - 46 y.o. female MRN [id number]  Date of birth: 11/10/1975 ? ?SUBJECTIVE:  Including CC & ROS.  ?No chief complaint on file. ? ? ?[name] is a 46 y.o. female that is presenting with right-sided low back pain with right hip pain and radicular symptoms.  Pain is acute on chronic in nature.  Has recently started a past few months ago.  Occurs on a regular basis.  Does feel pain down to the foot at times. ? ?Reviewed the note from 2/6 shows she was demonstrating hip pain. ? ? ?Review of Systems ?See HPI  ? ?HISTORY: Past Medical, Surgical, Social, and Family History Reviewed & Updated per EMR.   ?Pertinent Historical Findings include: ? ?Past Medical History:  ?Diagnosis Date  ? Adult ADHD   ? Allergy   ? Anemia   ? History of : ? No iron testing done?  Oral iron no help for Hb per pt.  Vit B12 helps (oral/otc) per pt.  Old record shows only one Hb from 2014 and it was normal.  ? Anxiety and depression   ? when going through separation was on citalopram temporarily.  ? Endometriosis   ? History of abnormal cervical Pap smear 2022  ? at GYN in [facility]  ? History of menorrhagia   ? Iron deficiency anemia 09/27/2020  ? +hx menorrhagia. Hemoccults neg x3, starting iron supp, plan set to recheck iron in 4-6 wks.  ? Migraine syndrome   ? Uterine leiomyoma   ? Uterine polyp 2022  ? at GYN in [facility]  ? Venous insufficiency of both lower extremities   ? low Na + compression hose.  ? ? ?Past Surgical History:  ?Procedure Laterality Date  ? BREAST LUMPECTOMY W/ NEEDLE LOCALIZATION    ? DILATATION & CURETTAGE/HYSTEROSCOPY WITH MYOSURE N/A 03/24/2021  ? Procedure: [place];  Surgeon: Servando Salina, MD;  Location: [facility];  Service: Gynecology;  Laterality: N/A;  ? DILATION AND CURETTAGE OF UTERUS N/A   ? 1997 and 2002  ? ENDOMETRIAL ABLATION N/A 03/24/2021  ? Procedure: ENDOMETRIAL ABLATION;  Surgeon: Servando Salina, MD;  Location: [facility];  Service: Gynecology;  Laterality: N/A;  ? TONSILLECTOMY    ? ? ? ?PHYSICAL EXAM:  ?VS: BP 136/80   Ht '5\' 3"'$  (1.6 m)   Wt 154 lb (69.9 kg)   BMI 27.28 kg/m?  ?Physical Exam ?Gen: NAD, alert, cooperative with exam, well-appearing ?MSK:  ?Neurovascularly intact   ? ? ?Aspiration/Injection Procedure Note ?Arville Care ?March 12, 1976 ? ?Procedure: Injection ?Indications: Right SI joint pain ? ?Procedure Details ?Consent: Risks of procedure as well as the alternatives and risks of each were explained to the (patient/caregiver).  Consent for procedure obtained. ?Time Out: Verified patient identification, verified procedure, site/side was marked, verified correct patient position, special equipment/implants available, medications/allergies/relevent history reviewed, required imaging and test results available.  Performed.  The area was cleaned with iodine and alcohol swabs.   ? ?The right SI joint was injected using 3 cc of 1% lidocaine on a 22-gauge 3-1/2 inch needle.  The syringe was switched and a mixture containing 1 cc's of 40 mg Kenalog and 4 cc's of 0.25% bupivacaine was injected.  All ultrasound was needed for visualization motion of the endpoint of the needle and joint.  Ultrasound was used. Images were obtained in short views showing the injection.   ? ? ?A sterile dressing was applied. ? ?Patient did tolerate procedure  well. ? ? ? ? ?ASSESSMENT & PLAN:  ? ?Sacroiliac joint dysfunction of right side ?Acutely occurring.  Does have pain over the SI joint with radicular type pain. ?-Counseled on home exercise therapy and supportive care. ?-Injection today. ?-Could consider further imaging or physical therapy. ? ?Greater trochanteric pain syndrome of right lower extremity ?Acutely occurring.  Does have tenderness over the greater tuberosity. ?-Counseled on home exercise therapy and supportive care. ?-X-ray today. ?-Could consider injection or physical therapy. ? ? ? ? ?

## 2021-05-29 NOTE — Patient Instructions (Signed)
Nice to meet you ?Please try ice  ?Please try the exercises   ?Please send me a message in MyChart with any questions or updates.  ?Please see me back in 3-4 weeks.  ? ?--Dr. Raeford Razor ? ?

## 2021-05-29 NOTE — Assessment & Plan Note (Signed)
Acutely occurring.  Does have tenderness over the greater tuberosity. ?-Counseled on home exercise therapy and supportive care. ?-X-ray today. ?-Could consider injection or physical therapy. ?

## 2021-05-29 NOTE — Telephone Encounter (Signed)
Informed of results.  ? ?Rosemarie Ax, MD ?[facility] ?05/29/2021, 4:51 PM ? ?

## 2021-06-20 ENCOUNTER — Encounter: Payer: Self-pay | Admitting: Family Medicine

## 2021-06-20 ENCOUNTER — Ambulatory Visit: Payer: Self-pay

## 2021-06-20 ENCOUNTER — Ambulatory Visit (INDEPENDENT_AMBULATORY_CARE_PROVIDER_SITE_OTHER): Payer: 59 | Admitting: Family Medicine

## 2021-06-20 VITALS — BP 118/82 | Ht 63.0 in | Wt 154.0 lb

## 2021-06-20 DIAGNOSIS — S76311A Strain of muscle, fascia and tendon of the posterior muscle group at thigh level, right thigh, initial encounter: Secondary | ICD-10-CM | POA: Insufficient documentation

## 2021-06-20 DIAGNOSIS — M79604 Pain in right leg: Secondary | ICD-10-CM

## 2021-06-20 NOTE — Patient Instructions (Signed)
Good to see you ?Please try heat  ?Please try the exercises  ?Please consider the compression   ?Please send me a message in MyChart with any questions or updates.  ?Please see me back to continue shockwave therapy .  ? ?--Dr. Raeford Razor ? ?

## 2021-06-20 NOTE — Progress Notes (Signed)
?Carol Beasley - 46 y.o. female MRN [id number]  Beasley of birth: 1975/11/30 ? ?SUBJECTIVE:  Including CC & ROS.  ?No chief complaint on file. ? ? ?Carol Beasley is a 46 y.o. female that is presenting with more hamstring pain today.  Did not notice much improvement with the SI joint injection.  She is having limited extension of the leg.  The pain is localized to the midportion of the right hamstring.  She felt a pop in the back of her leg a few days ago. ? ? ?Review of Systems ?See HPI  ? ?HISTORY: Past Medical, Surgical, Social, and Family History Reviewed & Updated per EMR.   ?Pertinent Historical Findings include: ? ?Past Medical History:  ?Carol Beasley  ? Adult ADHD   ? Allergy   ? Anemia   ? History of : ? No iron testing done?  Oral iron no help for Hb per pt.  Vit B12 helps (oral/otc) per pt.  Old record shows only one Hb from 2014 and it was normal.  ? Anxiety and depression   ? when going through separation was on citalopram temporarily.  ? Endometriosis   ? History of abnormal cervical Pap smear 2022  ? at GYN in [facility]  ? History of menorrhagia   ? Iron deficiency anemia 09/27/2020  ? +hx menorrhagia. Hemoccults neg x3, starting iron supp, plan set to recheck iron in 4-6 wks.  ? Migraine syndrome   ? Uterine leiomyoma   ? Uterine polyp 2022  ? at GYN in [facility]  ? Venous insufficiency of both lower extremities   ? low Na + compression hose.  ? ? ?Past Surgical History:  ?Procedure Laterality Beas[place]  ? BREAST LUMPECTOMY W/ NEEDLE LOCALIZATION    ? DILATATION & CURETTAGE/HYSTEROSCOPY WITH MYOSURE N/A 03/24/2021  ? Procedure: [place];  Surgeon: Servando Salina, MD;  Location: [facility];  Service: Gynecology;  Laterality: N/A;  ? DILATION AND CURETTAGE OF UTERUS N/A   ? 1997 and 2002  ? ENDOMETRIAL ABLATION N/A 03/24/2021  ? Procedure: ENDOMETRIAL ABLATION;  Surgeon: Servando Salina, MD;  Location: [facility];  Service: Gynecology;   Laterality: N/A;  ? TONSILLECTOMY    ? ? ? ?PHYSICAL EXAM:  ?VS: BP 118/82 (BP Location: Left Arm, Patient Position: Sitting)   Ht '5\' 3"'$  (1.6 m)   Wt 154 lb (69.9 kg)   BMI 27.28 kg/m?  ?Physical Exam ?Gen: NAD, alert, cooperative with exam, well-appearing ?MSK:  ?Neurovascularly intact   ? ?Limited ultrasound: Right hamstring: ? ?Having increased hyperemia around the fascial sheath of the semimembranosus and semitendinosis.  No discernible muscle tear appreciated. ?Normal appearance at the origin of the ischial tuberosity ? ?Summary: Findings were consistent with hamstring strain. ? ?Ultrasound and interpretation by Clearance Coots, MD ? ?ECSWT Note ?Carol Beasley ?05/24/1975 ? ?Procedure: ECSWT ?Indications: right hamstring pain  ? ?Procedure Details ?Consent: Risks of procedure as well as the alternatives and risks of each were explained to the (patient/caregiver).  Consent for procedure obtained. ?Time Out: Verified patient identification, verified procedure, site/side was marked, verified correct patient position, special equipment/implants available, medications/allergies/relevent history reviewed, required imaging and test results available.  Performed.  The area was cleaned with iodine and alcohol swabs.   ? ?The right hamstring was targeted for Extracorporeal shockwave therapy.  ? ?Preset: muscle injury  ?Power Level: 90 ?Frequency: 10 ?Impulse/cycles: 2500 ?Head size: large  ?Session: 1st ? ?Patient did tolerate procedure well. ? ? ?  ASSESSMENT & PLAN:  ? ?Hamstring strain, right, initial encounter ?Acutely occurring.  Symptoms seem more consistent with a mid belly hamstring change.  No ecchymosis appreciated.  No change appreciated at the origin. ?-Counseled on home exercise therapy and supportive Beasley. ?-Shockwave therapy today. ?-Counseled on compression. ?-Could consider injection or physical therapy. ? ? ? ? ?

## 2021-06-20 NOTE — Assessment & Plan Note (Signed)
Acutely occurring.  Symptoms seem more consistent with a mid belly hamstring change.  No ecchymosis appreciated.  No change appreciated at the origin. ?-Counseled on home exercise therapy and supportive care. ?-Shockwave therapy today. ?-Counseled on compression. ?-Could consider injection or physical therapy. ?

## 2021-06-22 ENCOUNTER — Other Ambulatory Visit: Payer: Self-pay | Admitting: Family Medicine

## 2021-06-22 ENCOUNTER — Encounter: Payer: Self-pay | Admitting: Family Medicine

## 2021-06-22 MED ORDER — HYDROCODONE-ACETAMINOPHEN 5-325 MG PO TABS
1.0000 | ORAL_TABLET | Freq: Three times a day (TID) | ORAL | 0 refills | Status: DC | PRN
Start: 2021-06-22 — End: 2022-06-08

## 2021-06-22 NOTE — Progress Notes (Signed)
Provided norco for severe pain.  ? ?Rosemarie Ax, MD ?[facility] ?06/22/2021, 4:42 PM ? ?

## 2021-06-27 ENCOUNTER — Telehealth: Payer: Self-pay | Admitting: Family Medicine

## 2021-06-27 NOTE — Telephone Encounter (Signed)
DOS: 06/20/21 exercise equipment/theraband charge deleted.  ?

## 2021-06-27 NOTE — Telephone Encounter (Signed)
Pt 's Ins plan/ [employer] kicked back chrg submission for code [phone] (exercise equip)chrg for $3.00 ?--forwarding message to med asst to remove charge  ?

## 2022-06-05 ENCOUNTER — Emergency Department (HOSPITAL_BASED_OUTPATIENT_CLINIC_OR_DEPARTMENT_OTHER)
Admission: EM | Admit: 2022-06-05 | Discharge: 2022-06-06 | Disposition: A | Discharge status: Home / Self Care | Payer: 59 | Attending: Emergency Medicine | Admitting: Emergency Medicine

## 2022-06-05 ENCOUNTER — Emergency Department (HOSPITAL_BASED_OUTPATIENT_CLINIC_OR_DEPARTMENT_OTHER): Payer: 59

## 2022-06-05 ENCOUNTER — Other Ambulatory Visit: Payer: Self-pay

## 2022-06-05 DIAGNOSIS — W540XXA Bitten by dog, initial encounter: Secondary | ICD-10-CM | POA: Diagnosis not present

## 2022-06-05 DIAGNOSIS — Z23 Encounter for immunization: Secondary | ICD-10-CM | POA: Diagnosis not present

## 2022-06-05 DIAGNOSIS — S61512A Laceration without foreign body of left wrist, initial encounter: Secondary | ICD-10-CM

## 2022-06-05 MED ORDER — LIDOCAINE HCL (PF) 1 % IJ SOLN
5.0000 mL | Freq: Once | INTRAMUSCULAR | Status: AC
  Administered 2022-06-05: 5 mL
  Filled 2022-06-05: qty 5

## 2022-06-05 MED ORDER — AMOXICILLIN-POT CLAVULANATE 875-125 MG PO TABS
1.0000 | ORAL_TABLET | Freq: Once | ORAL | Status: AC
Start: 2022-06-05 — End: 2022-06-05
  Administered 2022-06-05: 1 via ORAL
  Filled 2022-06-05: qty 1

## 2022-06-05 NOTE — ED Triage Notes (Signed)
Reports pain to L hand s/p getting in the middle of her dogs fighting. Reports dogs are UTD on vaccines.

## 2022-06-06 MED ORDER — TETANUS-DIPHTH-ACELL PERTUSSIS 5-2.5-18.5 LF-MCG/0.5 IM SUSY
0.5000 mL | PREFILLED_SYRINGE | Freq: Once | INTRAMUSCULAR | Status: AC
Start: 2022-06-06 — End: 2022-06-06
  Administered 2022-06-06: 0.5 mL via INTRAMUSCULAR
  Filled 2022-06-06: qty 0.5

## 2022-06-06 MED ORDER — AMOXICILLIN-POT CLAVULANATE 875-125 MG PO TABS: 1.0000 | ORAL_TABLET | Freq: Two times a day (BID) | ORAL | 0 refills | Status: AC

## 2022-06-06 NOTE — ED Provider Notes (Signed)
[facility] Emergency Department Provider Note MRN:  [id number]  Arrival date & time: 06/06/22     Chief Complaint   Animal Bite   History of Present Illness   Carol Beasley is a 47 y.o. year-old female with no pertinent past medical history presenting to the ED with chief complaint of animal bite.  Patient was trying to separate her 2 dogs that were fighting.  Sustained a laceration to her left dorsal wrist.  No other injuries or complaints.  Dog is up-to-date on rabies vaccinations.  Review of Systems  A thorough review of systems was obtained and all systems are negative except as noted in the HPI and PMH.   Patient's Health History    Past Medical History:  Diagnosis Date   Adult ADHD    Allergy    Anemia    History of : ? No iron testing done?  Oral iron no help for Hb per pt.  Vit B12 helps (oral/otc) per pt.  Old record shows only one Hb from 2014 and it was normal.   Anxiety and depression    when going through separation was on citalopram temporarily.   Endometriosis    History of abnormal cervical Pap smear 2022   at GYN in [facility]   History of menorrhagia    Iron deficiency anemia 09/27/2020   +hx menorrhagia. Hemoccults neg x3, starting iron supp, plan set to recheck iron in 4-6 wks.   Migraine syndrome    Uterine leiomyoma    Uterine polyp 2022   at GYN in [facility]   Venous insufficiency of both lower extremities    low Na + compression hose.    Past Surgical History:  Procedure Laterality Date   BREAST LUMPECTOMY W/ NEEDLE LOCALIZATION     DILATATION & CURETTAGE/HYSTEROSCOPY WITH MYOSURE N/A 03/24/2021   Procedure: DILATATION & CURETTAGE/HYSTEROSCOPY WITH MYOSURE;  Surgeon: Servando Salina, MD;  Location: [place];  Service: Gynecology;  Laterality: N/A;   [place] OF UTERUS N/A    1997 and 2002   ENDOMETRIAL ABLATION N/A 03/24/2021   Procedure: ENDOMETRIAL ABLATION;  Surgeon: Servando Salina, MD;   Location: [place];  Service: Gynecology;  Laterality: N/A;   TONSILLECTOMY      Family History  Problem Relation Age of Onset   Cancer Mother        Ovarian   Hypertension Mother    Irritable bowel syndrome Mother    Crohn's disease Mother    Rheum arthritis Mother    COPD Father    Hypertension Sister    Hypertension Brother     Social History   Socioeconomic History   Marital status: Divorced    Spouse name: Not on file   Number of children: Not on file   Years of education: Not on file   Highest education level: Not on file  Occupational History   Not on file  Tobacco Use   Smoking status: Never   Smokeless tobacco: Never  Vaping Use   Vaping Use: Never used  Substance and Sexual Activity   Alcohol use: No   Drug use: No   Sexual activity: Yes    Comment: husband - vasectomy  Other Topics Concern   Not on file  Social History Narrative   Divorced 2021/2022.  Has 3 sons, 1 daughter.   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   No T/A/Ds.  Exercise: couple days a week (biking, other).   Social Determinants of Health   Financial Resource Strain: Not on file  Food Insecurity: Not on file  Transportation Needs: Not on file  Physical Activity: Not on file  Stress: Not on file  Social Connections: Not on file  Intimate Partner Violence: Not on file     Physical Exam   Vitals:   06/05/22 2043  BP: (!) 140/65  Pulse: 78  Resp: 20  Temp: 98.4 F (36.9 C)  SpO2: 100%    CONSTITUTIONAL: Well-appearing, NAD NEURO/PSYCH:  Alert and oriented x 3, no focal deficits EYES:  eyes equal and reactive ENT/NECK:  no LAD, no JVD CARDIO: Regular rate, well-perfused, normal S1 and S2 PULM:  CTAB no wheezing or rhonchi GI/GU:  non-distended, non-tender MSK/SPINE:  No gross deformities, no edema SKIN:  no rash, laceration to left dorsal wrist   *Additional and/or pertinent findings  included in MDM below  Diagnostic and Interventional Summary    EKG Interpretation  Date/Time:    Ventricular Rate:    PR Interval:    QRS Duration:   QT Interval:    QTC Calculation:   R Axis:     Text Interpretation:         Labs Reviewed - No data to display  DG Hand Complete Left  Final Result    DG Wrist Complete Left  Final Result      Medications  Tdap (BOOSTRIX) injection 0.5 mL (has no administration in time range)  amoxicillin-clavulanate (AUGMENTIN) 875-125 MG per tablet 1 tablet (1 tablet Oral Given 06/05/22 2347)  lidocaine (PF) (XYLOCAINE) 1 % injection 5 mL (5 mLs Infiltration Given 06/05/22 2348)     Procedures  /  Critical Care .Marland KitchenLaceration Repair  Date/Time: 06/06/2022 12:18 AM  Performed by: Maudie Flakes, MD Authorized by: Maudie Flakes, MD   Consent:    Consent obtained:  Verbal   Consent given by:  Patient   Risks, benefits, and alternatives were discussed: yes     Risks discussed:  Need for additional repair, infection, nerve damage, poor wound healing, poor cosmetic result, pain, retained foreign body, tendon damage and vascular damage   Alternatives discussed:  No treatment Universal protocol:    Procedure explained and questions answered to patient or proxy's satisfaction: yes     Immediately prior to procedure, a time out was called: yes     Patient identity confirmed:  Verbally with patient Anesthesia:    Anesthesia method:  Local infiltration   Local anesthetic:  Lidocaine 1% w/o epi Laceration details:    Location: Left wrist.   Length (cm):  3   Depth (mm):  2 Pre-procedure details:    Preparation:  Patient was prepped and draped in usual sterile fashion Exploration:    Limited defect created (wound extended): no     Hemostasis achieved with:  Direct pressure   Imaging obtained: x-ray     Imaging outcome: foreign body not noted     Wound exploration: wound explored through full range of motion and entire depth of wound  visualized     Wound extent: no foreign body, no signs of injury, no nerve damage, no tendon damage, no underlying fracture and no vascular damage     Contaminated: no   Treatment:    Area cleansed with:  Saline   Amount of cleaning:  Extensive Skin repair:    Repair method:  Sutures   Suture size:  4-0   Suture  material:  Prolene   Suture technique:  Simple interrupted   Number of sutures:  4 Approximation:    Approximation:  Loose Repair type:    Repair type:  Simple Post-procedure details:    Dressing:  Non-adherent dressing   Procedure completion:  Tolerated well, no immediate complications   ED Course and Medical Decision Making  Initial Impression and Ddx Dog bite laceration.  Having some tenderness and pain to the wrist and hand.  X-ray to exclude fracture, foreign body.  Past medical/surgical history that increases complexity of ED encounter: None  Interpretation of Diagnostics I personally reviewed the wrist x-ray and my interpretation is as follows: No fracture or foreign body    Patient Reassessment and Ultimate Disposition/Management     We discussed the pros and cons of laceration repair.  The wound is a bit gaping and so decision was made to loosely close the wound.  See above for details.  Patient started on Augmentin, updated tetanus.  Appropriate for discharge.  Patient management required discussion with the following services or consulting groups:  None  Complexity of Problems Addressed Acute complicated illness or Injury  Additional Data Reviewed and Analyzed Further history obtained from: Further history from spouse/family member  Additional Factors Impacting ED Encounter Risk Prescriptions  Barth Kirks. [place] Small, [place] [email or web address]  Final Clinical Impressions(s) / ED Diagnoses     ICD-10-CM   1. Dog bite, initial encounter  W54.0XXA     2. Laceration of left wrist, initial  encounter  S61.512A       ED Discharge Orders          Ordered    amoxicillin-clavulanate (AUGMENTIN) 875-125 MG tablet  Every 12 hours        06/06/22 0016             Discharge Instructions Discussed with and Provided to Patient:    Discharge Instructions      You were evaluated in the Emergency Department and after careful evaluation, we did not find any emergent condition requiring admission or further testing in the hospital.  Your exam/testing today was overall reassuring.  We repaired your laceration here in the emergency department.  We discussed the importance of checking the wound daily for signs of infection.  Take the Augmentin antibiotic as directed.  Your sutures will need to be removed in 7 to 10 days by healthcare professional.  Please return to the Emergency Department if you experience any worsening of your condition.  Thank you for allowing [place] to be a part of your care.       Maudie Flakes, MD 06/06/22 [id number]

## 2022-06-06 NOTE — Discharge Instructions (Addendum)
You were evaluated in the Emergency Department and after careful evaluation, we did not find any emergent condition requiring admission or further testing in the hospital.  Your exam/testing today was overall reassuring.  We repaired your laceration here in the emergency department.  We discussed the importance of checking the wound daily for signs of infection.  Take the Augmentin antibiotic as directed.  Your sutures will need to be removed in 7 to 10 days by healthcare professional.  Please return to the Emergency Department if you experience any worsening of your condition.  Thank you for allowing [place] to be a part of your care.

## 2022-06-06 NOTE — ED Notes (Signed)
Non stick dressing applied to sutured laceration

## 2022-06-07 ENCOUNTER — Encounter (HOSPITAL_COMMUNITY)
Admission: EM | Disposition: A | Discharge status: Home / Self Care | Payer: Self-pay | Source: Home / Self Care | Attending: Internal Medicine

## 2022-06-07 ENCOUNTER — Other Ambulatory Visit: Payer: Self-pay

## 2022-06-07 ENCOUNTER — Observation Stay (HOSPITAL_BASED_OUTPATIENT_CLINIC_OR_DEPARTMENT_OTHER)
Admission: EM | Admit: 2022-06-07 | Discharge: 2022-06-08 | Disposition: A | Discharge status: Home / Self Care | Payer: 59 | Attending: Internal Medicine | Admitting: Internal Medicine

## 2022-06-07 ENCOUNTER — Observation Stay (HOSPITAL_COMMUNITY): Payer: 59 | Admitting: Certified Registered"

## 2022-06-07 ENCOUNTER — Observation Stay (HOSPITAL_BASED_OUTPATIENT_CLINIC_OR_DEPARTMENT_OTHER): Payer: 59 | Admitting: Certified Registered"

## 2022-06-07 ENCOUNTER — Encounter (HOSPITAL_BASED_OUTPATIENT_CLINIC_OR_DEPARTMENT_OTHER): Payer: Self-pay | Admitting: Pediatrics

## 2022-06-07 ENCOUNTER — Emergency Department (HOSPITAL_BASED_OUTPATIENT_CLINIC_OR_DEPARTMENT_OTHER): Payer: 59

## 2022-06-07 DIAGNOSIS — L02414 Cutaneous abscess of left upper limb: Secondary | ICD-10-CM | POA: Diagnosis not present

## 2022-06-07 DIAGNOSIS — L03119 Cellulitis of unspecified part of limb: Secondary | ICD-10-CM | POA: Insufficient documentation

## 2022-06-07 DIAGNOSIS — R7309 Other abnormal glucose: Secondary | ICD-10-CM | POA: Insufficient documentation

## 2022-06-07 DIAGNOSIS — L03114 Cellulitis of left upper limb: Secondary | ICD-10-CM | POA: Diagnosis not present

## 2022-06-07 DIAGNOSIS — W540XXA Bitten by dog, initial encounter: Secondary | ICD-10-CM | POA: Insufficient documentation

## 2022-06-07 DIAGNOSIS — G43109 Migraine with aura, not intractable, without status migrainosus: Secondary | ICD-10-CM | POA: Diagnosis present

## 2022-06-07 DIAGNOSIS — S61552A Open bite of left wrist, initial encounter: Secondary | ICD-10-CM

## 2022-06-07 DIAGNOSIS — F988 Other specified behavioral and emotional disorders with onset usually occurring in childhood and adolescence: Secondary | ICD-10-CM | POA: Diagnosis present

## 2022-06-07 HISTORY — PX: I & D EXTREMITY: SHX5045

## 2022-06-07 LAB — BASIC METABOLIC PANEL
Anion gap: 8 (ref 5–15)
BUN: 16 mg/dL (ref 6–20)
CO2: 28 mmol/L (ref 22–32)
Calcium: 9.6 mg/dL (ref 8.9–10.3)
Chloride: 103 mmol/L (ref 98–111)
Creatinine, Ser: 0.77 mg/dL (ref 0.44–1.00)
GFR, Estimated: >60 mL/min (ref >60)
Glucose, Bld: 93 mg/dL (ref 70–99)
Potassium: 3.9 mmol/L (ref 3.5–5.1)
Sodium: 139 mmol/L (ref 135–145)

## 2022-06-07 LAB — CBC WITH DIFFERENTIAL/PLATELET
Abs Immature Granulocytes: 0.02 10*3/uL (ref 0.00–0.07)
Basophils Absolute: 0 10*3/uL (ref 0.0–0.1)
Basophils Relative: 0 %
Eosinophils Absolute: 0.1 10*3/uL (ref 0.0–0.5)
Eosinophils Relative: 1 %
HCT: 41 % (ref 36.0–46.0)
Hemoglobin: 13.5 g/dL (ref 12.0–15.0)
Immature Granulocytes: 0 %
Lymphocytes Relative: 22 %
Lymphs Abs: 1.6 10*3/uL (ref 0.7–4.0)
MCH: 30.1 pg (ref 26.0–34.0)
MCHC: 32.9 g/dL (ref 30.0–36.0)
MCV: 91.3 fL (ref 80.0–100.0)
Monocytes Absolute: 0.5 10*3/uL (ref 0.1–1.0)
Monocytes Relative: 7 %
Neutro Abs: 5 10*3/uL (ref 1.7–7.7)
Neutrophils Relative %: 70 %
Platelets: 253 10*3/uL (ref 150–400)
RBC: 4.49 MIL/uL (ref 3.87–5.11)
RDW: 12.9 % (ref 11.5–15.5)
WBC: 7.2 10*3/uL (ref 4.0–10.5)
nRBC: 0 % (ref 0.0–0.2)

## 2022-06-07 LAB — LACTIC ACID, PLASMA: Lactic Acid, Venous: 0.5 mmol/L (ref 0.5–1.9)

## 2022-06-07 SURGERY — IRRIGATION AND DEBRIDEMENT EXTREMITY
Anesthesia: General | Laterality: Left

## 2022-06-07 MED ORDER — ACETAMINOPHEN 650 MG RE SUPP: 650.0000 mg | Freq: Four times a day (QID) | RECTAL | Status: DC | PRN

## 2022-06-07 MED ORDER — LISDEXAMFETAMINE DIMESYLATE 20 MG PO CAPS: 40.0000 mg | ORAL_CAPSULE | ORAL | Status: DC

## 2022-06-07 MED ORDER — SUMATRIPTAN SUCCINATE 50 MG PO TABS: 50.0000 mg | ORAL_TABLET | ORAL | Status: DC | PRN

## 2022-06-07 MED ORDER — SERTRALINE HCL 25 MG PO TABS: 25.0000 mg | ORAL_TABLET | Freq: Every day | ORAL | Status: DC

## 2022-06-07 MED ORDER — SODIUM CHLORIDE 0.9 % IV SOLN: INTRAVENOUS | Status: DC

## 2022-06-07 MED ORDER — ACETAMINOPHEN 10 MG/ML IV SOLN
INTRAVENOUS | Status: AC
  Filled 2022-06-07: qty 100

## 2022-06-07 MED ORDER — ACETAMINOPHEN 325 MG PO TABS: 650.0000 mg | ORAL_TABLET | Freq: Four times a day (QID) | ORAL | Status: DC | PRN

## 2022-06-07 MED ORDER — BUPIVACAINE HCL (PF) 0.25 % IJ SOLN
INTRAMUSCULAR | Status: AC
  Filled 2022-06-07: qty 10

## 2022-06-07 MED ORDER — MIDAZOLAM HCL 5 MG/5ML IJ SOLN
INTRAMUSCULAR | Status: DC | PRN
  Administered 2022-06-07: 2 mg via INTRAVENOUS

## 2022-06-07 MED ORDER — EPHEDRINE SULFATE (PRESSORS) 50 MG/ML IJ SOLN
INTRAMUSCULAR | Status: DC | PRN
  Administered 2022-06-07: 2.5 mg via INTRAVENOUS

## 2022-06-07 MED ORDER — FENTANYL CITRATE (PF) 250 MCG/5ML IJ SOLN
INTRAMUSCULAR | Status: AC
  Filled 2022-06-07: qty 5

## 2022-06-07 MED ORDER — ONDANSETRON HCL 4 MG PO TABS: 4.0000 mg | ORAL_TABLET | Freq: Four times a day (QID) | ORAL | Status: DC | PRN

## 2022-06-07 MED ORDER — MIDAZOLAM HCL 2 MG/2ML IJ SOLN
INTRAMUSCULAR | Status: AC
  Filled 2022-06-07: qty 2

## 2022-06-07 MED ORDER — ONDANSETRON HCL 4 MG/2ML IJ SOLN: 4.0000 mg | Freq: Four times a day (QID) | INTRAMUSCULAR | Status: DC | PRN

## 2022-06-07 MED ORDER — LACTATED RINGERS IV SOLN: INTRAVENOUS | Status: DC | PRN

## 2022-06-07 MED ORDER — MORPHINE SULFATE (PF) 4 MG/ML IV SOLN
4.0000 mg | Freq: Once | INTRAVENOUS | Status: AC
  Administered 2022-06-07: 4 mg via INTRAVENOUS
  Filled 2022-06-07: qty 1

## 2022-06-07 MED ORDER — CLONAZEPAM 0.5 MG PO TBDP: 0.5000 mg | ORAL_TABLET | Freq: Every evening | ORAL | Status: DC | PRN

## 2022-06-07 MED ORDER — SODIUM CHLORIDE 0.9 % IV SOLN
3.0000 g | Freq: Once | INTRAVENOUS | Status: AC
  Administered 2022-06-07: 3 g via INTRAVENOUS

## 2022-06-07 MED ORDER — ONDANSETRON HCL 4 MG/2ML IJ SOLN
INTRAMUSCULAR | Status: DC | PRN
  Administered 2022-06-07: 4 mg via INTRAVENOUS

## 2022-06-07 MED ORDER — OXYCODONE HCL 5 MG PO TABS: 5.0000 mg | ORAL_TABLET | ORAL | Status: DC | PRN

## 2022-06-07 MED ORDER — LIDOCAINE HCL (CARDIAC) PF 50 MG/5ML IV SOSY
PREFILLED_SYRINGE | INTRAVENOUS | Status: DC | PRN
  Administered 2022-06-07: 60 mg via INTRAVENOUS

## 2022-06-07 MED ORDER — PROPOFOL 10 MG/ML IV BOLUS
INTRAVENOUS | Status: DC | PRN
  Administered 2022-06-07: 150 mg via INTRAVENOUS
  Administered 2022-06-07: 40 mg via INTRAVENOUS

## 2022-06-07 MED ORDER — MORPHINE SULFATE (PF) 2 MG/ML IV SOLN
2.0000 mg | INTRAVENOUS | Status: DC | PRN
  Administered 2022-06-07 – 2022-06-08 (×5): 2 mg via INTRAVENOUS
  Filled 2022-06-07 (×5): qty 1

## 2022-06-07 MED ORDER — DEXAMETHASONE SODIUM PHOSPHATE 4 MG/ML IJ SOLN
INTRAMUSCULAR | Status: DC | PRN
  Administered 2022-06-07: 10 mg via INTRAVENOUS

## 2022-06-07 MED ORDER — FENTANYL CITRATE (PF) 100 MCG/2ML IJ SOLN
INTRAMUSCULAR | Status: DC | PRN
  Administered 2022-06-07 (×2): 50 ug via INTRAVENOUS

## 2022-06-07 MED ORDER — SODIUM CHLORIDE 0.9 % IV SOLN
3.0000 g | Freq: Four times a day (QID) | INTRAVENOUS | Status: DC
  Administered 2022-06-07 – 2022-06-08 (×3): 3 g via INTRAVENOUS
  Filled 2022-06-07 (×5): qty 8

## 2022-06-07 MED ORDER — SODIUM CHLORIDE 0.9 % IV SOLN
INTRAVENOUS | Status: DC | PRN
  Administered 2022-06-07: 50 mL/h via INTRAVENOUS

## 2022-06-07 SURGICAL SUPPLY — 61 items
ADAPTER CATH SYR TO TUBING 38M (ADAPTER) ×2 IMPLANT
ADPR CATH LL SYR 3/32 TPR (ADAPTER) ×1
BAG COUNTER SPONGE SURGICOUNT (BAG) ×2 IMPLANT
BAG SPNG CNTER NS LX DISP (BAG) ×1
BNDG CMPR 5X2 CHSV 1 LYR STRL (GAUZE/BANDAGES/DRESSINGS)
BNDG CMPR 5X3 KNIT ELC UNQ LF (GAUZE/BANDAGES/DRESSINGS) ×1
BNDG CMPR 9X4 STRL LF SNTH (GAUZE/BANDAGES/DRESSINGS)
BNDG COHESIVE 2X5 TAN ST LF (GAUZE/BANDAGES/DRESSINGS) IMPLANT
BNDG ELASTIC 3INX 5YD STR LF (GAUZE/BANDAGES/DRESSINGS) ×2 IMPLANT
BNDG ELASTIC 4X5.8 VLCR STR LF (GAUZE/BANDAGES/DRESSINGS) ×2 IMPLANT
BNDG ESMARK 4X9 LF (GAUZE/BANDAGES/DRESSINGS) IMPLANT
BNDG GAUZE DERMACEA FLUFF 4 (GAUZE/BANDAGES/DRESSINGS) ×2 IMPLANT
BNDG GZE DERMACEA 4 6PLY (GAUZE/BANDAGES/DRESSINGS) ×1
CANNULA VESSEL 3MM 2 BLNT TIP (CANNULA) IMPLANT
CORD BIPOLAR FORCEPS 12FT (ELECTRODE) ×2 IMPLANT
COVER SURGICAL LIGHT HANDLE (MISCELLANEOUS) ×2 IMPLANT
CUFF TOURN SGL QUICK 18X4 (TOURNIQUET CUFF) IMPLANT
CUFF TOURN SGL QUICK 24 (TOURNIQUET CUFF)
CUFF TRNQT CYL 24X4X16.5-23 (TOURNIQUET CUFF) IMPLANT
DRAIN PENROSE 12X.25 LTX STRL (MISCELLANEOUS) IMPLANT
GAUZE PAD ABD 8X10 STRL (GAUZE/BANDAGES/DRESSINGS) ×4 IMPLANT
GAUZE SPONGE 4X4 12PLY STRL (GAUZE/BANDAGES/DRESSINGS) ×2 IMPLANT
GAUZE XEROFORM 1X8 LF (GAUZE/BANDAGES/DRESSINGS) ×2 IMPLANT
GLOVE BIO SURGEON STRL SZ7.5 (GLOVE) ×2 IMPLANT
GLOVE BIOGEL PI IND STRL 6.5 (GLOVE) IMPLANT
GLOVE BIOGEL PI IND STRL 8 (GLOVE) ×2 IMPLANT
GLOVE BIOGEL PI IND STRL 8.5 (GLOVE) IMPLANT
GLOVE PI ORTHO PRO STRL SZ8 (GLOVE) IMPLANT
GLOVE SURG ORTHO 8.0 STRL STRW (GLOVE) IMPLANT
GOWN STRL REUS W/ TWL LRG LVL3 (GOWN DISPOSABLE) ×2 IMPLANT
GOWN STRL REUS W/ TWL XL LVL3 (GOWN DISPOSABLE) ×2 IMPLANT
GOWN STRL REUS W/TWL LRG LVL3 (GOWN DISPOSABLE) ×1
GOWN STRL REUS W/TWL XL LVL3 (GOWN DISPOSABLE) ×1
KIT BASIN OR (CUSTOM PROCEDURE TRAY) ×2 IMPLANT
KIT TURNOVER KIT B (KITS) ×2 IMPLANT
LOOP VASCLR MAXI BLUE 18IN ST (MISCELLANEOUS) IMPLANT
LOOP VASCULAR MAXI 18 BLUE (MISCELLANEOUS)
LOOPS VASCLR MAXI BLUE 18IN ST (MISCELLANEOUS) IMPLANT
MANIFOLD NEPTUNE II (INSTRUMENTS) IMPLANT
NDL HYPO 25X1 1.5 SAFETY (NEEDLE) IMPLANT
NEEDLE HYPO 25X1 1.5 SAFETY (NEEDLE) IMPLANT
NS IRRIG 1000ML POUR BTL (IV SOLUTION) ×2 IMPLANT
PACK ORTHO EXTREMITY (CUSTOM PROCEDURE TRAY) ×2 IMPLANT
PAD ARMBOARD 7.5X6 YLW CONV (MISCELLANEOUS) ×4 IMPLANT
SET CYSTO W/LG BORE CLAMP LF (SET/KITS/TRAYS/PACK) IMPLANT
SOL PREP POV-IOD 4OZ 10% (MISCELLANEOUS) ×4 IMPLANT
SPIKE FLUID TRANSFER (MISCELLANEOUS) ×2 IMPLANT
SPONGE T-LAP 4X18 ~~LOC~~+RFID (SPONGE) ×2 IMPLANT
SUT ETHILON 4 0 P 3 18 (SUTURE) IMPLANT
SUT ETHILON 4 0 PS 2 18 (SUTURE) IMPLANT
SUT MON AB 5-0 P3 18 (SUTURE) IMPLANT
SWAB COLLECTION DEVICE MRSA (MISCELLANEOUS) IMPLANT
SWAB CULTURE ESWAB REG 1ML (MISCELLANEOUS) IMPLANT
SYR 20ML LL LF (SYRINGE) ×2 IMPLANT
SYR CONTROL 10ML LL (SYRINGE) IMPLANT
TOWEL GREEN STERILE (TOWEL DISPOSABLE) ×2 IMPLANT
TUBE CONNECTING 12X1/4 (SUCTIONS) ×2 IMPLANT
TUBE NG 5FR 35IN ENFIT (TUBING) IMPLANT
UNDERPAD 30X36 HEAVY ABSORB (UNDERPADS AND DIAPERS) ×2 IMPLANT
VASCULAR TIE MAXI BLUE 18IN ST (MISCELLANEOUS)
YANKAUER SUCT BULB TIP NO VENT (SUCTIONS) ×2 IMPLANT

## 2022-06-07 NOTE — ED Triage Notes (Signed)
Reports swelling, increased pain, warm to touch and coldness on fingers on left hand after sutures on Tuesday for dog bite.

## 2022-06-07 NOTE — Progress Notes (Signed)
Patient to room 5C20 at this time

## 2022-06-07 NOTE — Anesthesia Preprocedure Evaluation (Addendum)
Anesthesia Evaluation  Patient identified by MRN, date of birth, ID band Patient awake    Reviewed: Allergy & Precautions, H&P , NPO status , Patient's Chart, lab work & pertinent test results  Airway Mallampati: II  TM Distance: >3 FB Neck ROM: Full    Dental no notable dental hx. (+) Teeth Intact, Dental Advisory Given   Pulmonary neg pulmonary ROS   Pulmonary exam normal breath sounds clear to auscultation       Cardiovascular negative cardio ROS  Rhythm:Regular Rate:Normal     Neuro/Psych  Headaches  Anxiety Depression       GI/Hepatic negative GI ROS, Neg liver ROS,,,  Endo/Other  negative endocrine ROS    Renal/GU negative Renal ROS  negative genitourinary   Musculoskeletal  (+) Arthritis , Osteoarthritis,    Abdominal   Peds  Hematology  (+) Blood dyscrasia, anemia   Anesthesia Other Findings   Reproductive/Obstetrics negative OB ROS                             Anesthesia Physical Anesthesia Plan  ASA: 2  Anesthesia Plan: General   Post-op Pain Management: Ofirmev IV (intra-op)* and Toradol IV (intra-op)*   Induction: Intravenous  PONV Risk Score and Plan: 4 or greater and Ondansetron, Dexamethasone and Midazolam  Airway Management Planned: LMA  Additional Equipment:   Intra-op Plan:   Post-operative Plan: Extubation in OR  Informed Consent: I have reviewed the patients History and Physical, chart, labs and discussed the procedure including the risks, benefits and alternatives for the proposed anesthesia with the patient or authorized representative who has indicated his/her understanding and acceptance.     Dental advisory given  Plan Discussed with: CRNA  Anesthesia Plan Comments:        Anesthesia Quick Evaluation

## 2022-06-07 NOTE — ED Notes (Signed)
Patient transported to X-ray 

## 2022-06-07 NOTE — ED Notes (Signed)
Belenda Cruise, RN given report for [facility].  Pt stable for transfer by POV, IV saline locked and secured with [place].  Pt verbalized understanding of where to go and how to get there.

## 2022-06-07 NOTE — Consult Note (Addendum)
Reason for Consult:Left hand infection Referring Physician: Fuller Plan Time called: [id number] Time at bedside: [place] is an 47 y.o. female.  HPI: Carol Beasley was bitten by her dog Tuesday night. She was seen, had the wound sutured, and was placed on abx. However, pain and redness continued to increase and she returned today and was admitted. Hand surgery was consulted. She works in [employer]ural [employer] and is RHD.  Past Medical History:  Diagnosis Date   Adult ADHD    Allergy    Anemia    History of : ? No iron testing done?  Oral iron no help for Hb per pt.  Vit B12 helps (oral/otc) per pt.  Old record shows only one Hb from 2014 and it was normal.   Anxiety and depression    when going through separation was on citalopram temporarily.   Endometriosis    History of abnormal cervical Pap smear 2022   at GYN in [facility]   History of menorrhagia    Iron deficiency anemia 09/27/2020   +hx menorrhagia. Hemoccults neg x3, starting iron supp, plan set to recheck iron in 4-6 wks.   Migraine syndrome    Uterine leiomyoma    Uterine polyp 2022   at GYN in [facility]   Venous insufficiency of both lower extremities    low Na + compression hose.    Past Surgical History:  Procedure Laterality Date   BREAST LUMPECTOMY W/ NEEDLE LOCALIZATION     DILATATION & CURETTAGE/HYSTEROSCOPY WITH MYOSURE N/A 03/24/2021   Procedure: DILATATION & CURETTAGE/HYSTEROSCOPY WITH MYOSURE;  Surgeon: Servando Salina, MD;  Location: [place];  Service: Gynecology;  Laterality: N/A;   [place] OF UTERUS N/A    1997 and 2002   ENDOMETRIAL ABLATION N/A 03/24/2021   Procedure: ENDOMETRIAL ABLATION;  Surgeon: Servando Salina, MD;  Location: [place];  Service: Gynecology;  Laterality: N/A;   TONSILLECTOMY      Family History  Problem Relation Age of Onset   Cancer Mother        Ovarian   Hypertension Mother    Irritable bowel syndrome Mother    Crohn's  disease Mother    Rheum arthritis Mother    COPD Father    Hypertension Sister    Hypertension Brother     Social History:  reports that she has never smoked. She has never used smokeless tobacco. She reports that she does not drink alcohol and does not use drugs.  Allergies:  Allergies  Allergen Reactions   Topiramate Other (See Comments)    "Can't function"    Medications: I have reviewed the patient's current medications.  Results for orders placed or performed during the hospital encounter of 06/07/22 (from the past 48 hour(s))  CBC with Differential     Status: None   Collection Time: 06/07/22 11:30 AM  Result Value Ref Range   WBC 7.2 4.0 - 10.5 K/uL   RBC 4.49 3.87 - 5.11 MIL/uL   Hemoglobin 13.5 12.0 - 15.0 g/dL   HCT 41.0 36.0 - 46.0 %   MCV 91.3 80.0 - 100.0 fL   MCH 30.1 26.0 - 34.0 pg   MCHC 32.9 30.0 - 36.0 g/dL   RDW 12.9 11.5 - 15.5 %   Platelets 253 150 - 400 K/uL   nRBC 0.0 0.0 - 0.2 %   Neutrophils Relative % 70 %   Neutro Abs 5.0 1.7 - 7.7 K/uL   Lymphocytes Relative 22 %  Lymphs Abs 1.6 0.7 - 4.0 K/uL   Monocytes Relative 7 %   Monocytes Absolute 0.5 0.1 - 1.0 K/uL   Eosinophils Relative 1 %   Eosinophils Absolute 0.1 0.0 - 0.5 K/uL   Basophils Relative 0 %   Basophils Absolute 0.0 0.0 - 0.1 K/uL   Immature Granulocytes 0 %   Abs Immature Granulocytes 0.02 0.00 - 0.07 K/uL    Comment: Performed at [facility], [address] [place], [place] [id number]  Basic metabolic panel     Status: None   Collection Time: 06/07/22 11:30 AM  Result Value Ref Range   Sodium 139 135 - 145 mmol/L   Potassium 3.9 3.5 - 5.1 mmol/L   Chloride 103 98 - 111 mmol/L   CO2 28 22 - 32 mmol/L   Glucose, Bld 93 70 - 99 mg/dL    Comment: Glucose reference range applies only to samples taken after fasting for at least 8 hours.   BUN 16 6 - 20 mg/dL   Creatinine, Ser 0.77 0.44 - 1.00 mg/dL   Calcium 9.6 8.9 - 10.3 mg/dL   GFR, Estimated >60 >60 mL/min     Comment: (NOTE) Calculated using the CKD-EPI Creatinine Equation (2021)    Anion gap 8 5 - 15    Comment: Performed at [facility], [address] [place], [place] [address]  Lactic acid, plasma     Status: None   Collection Time: 06/07/22 11:46 AM  Result Value Ref Range   Lactic Acid, Venous 0.5 0.5 - 1.9 mmol/L    Comment: Performed at [facility], [address], [place], [place] [address]    DG Wrist Complete Left  Result Date: 06/07/2022 CLINICAL DATA:  Recent dog bite.  Swelling EXAM: LEFT WRIST - COMPLETE 3 VIEW COMPARISON:  X-ray 06/05/2022 FINDINGS: No fracture or dislocation. Preserved joint spaces and bone mineralization. There is some dorsal soft tissue swelling and gas to the wrist. No radiopaque foreign body. If there is further concern of bone or other infection, cross-sectional imaging can be performed as clinically indicated IMPRESSION: Dorsal soft tissue swelling and gas.  No acute osseous abnormality Electronically Signed   By: Jill Side M.D.   On: 06/07/2022 12:40   DG Hand Complete Left  Result Date: 06/05/2022 CLINICAL DATA:  Dog bite EXAM: LEFT HAND - COMPLETE 3+ VIEW; LEFT WRIST - COMPLETE 3+ VIEW COMPARISON:  None Available. FINDINGS: There is no evidence of fracture or dislocation. There is no evidence of arthropathy or other focal bone abnormality. Soft tissue defect posterior wrist consistent with given history. No radiopaque foreign bodies. IMPRESSION: No fracture or foreign body. Soft tissue defect consistent with dog bite. Electronically Signed   By: Sammie Bench M.D.   On: 06/05/2022 21:13   DG Wrist Complete Left  Result Date: 06/05/2022 CLINICAL DATA:  Dog bite EXAM: LEFT HAND - COMPLETE 3+ VIEW; LEFT WRIST - COMPLETE 3+ VIEW COMPARISON:  None Available. FINDINGS: There is no evidence of fracture or dislocation. There is no evidence of arthropathy or other focal bone abnormality. Soft  tissue defect posterior wrist consistent with given history. No radiopaque foreign bodies. IMPRESSION: No fracture or foreign body. Soft tissue defect consistent with dog bite. Electronically Signed   By: Sammie Bench M.D.   On: 06/05/2022 21:13    Review of Systems  Constitutional:  Negative for chills, diaphoresis and fever.  HENT:  Negative for ear  discharge, ear pain, hearing loss and tinnitus.   Eyes:  Negative for photophobia and pain.  Respiratory:  Negative for cough and shortness of breath.   Cardiovascular:  Negative for chest pain.  Gastrointestinal:  Negative for abdominal pain, nausea and vomiting.  Genitourinary:  Negative for dysuria, flank pain, frequency and urgency.  Musculoskeletal:  Positive for arthralgias (Left hand). Negative for back pain, myalgias and neck pain.  Neurological:  Negative for dizziness and headaches.  Hematological:  Does not bruise/bleed easily.  Psychiatric/Behavioral:  The patient is not nervous/anxious.    Blood pressure 130/78, pulse 68, temperature 98.1 F (36.7 C), temperature source Oral, resp. rate 18, height 5\' 4"  (1.626 m), weight 79.4 kg, SpO2 100 %. Physical Exam Constitutional:      General: She is not in acute distress.    Appearance: She is well-developed. She is not diaphoretic.  HENT:     Head: Normocephalic and atraumatic.  Eyes:     General: No scleral icterus.       Right eye: No discharge.        Left eye: No discharge.     Conjunctiva/sclera: Conjunctivae normal.  Cardiovascular:     Rate and Rhythm: Normal rate and regular rhythm.  Pulmonary:     Effort: Pulmonary effort is normal. No respiratory distress.  Musculoskeletal:     Cervical back: Normal range of motion.     Comments: Left shoulder, elbow, wrist, digits- Sutured laceration dorsum of wrist, TTP ulnar post hand and post distal FA, no instability, no blocks to motion  Sens  Ax/R/M/U intact  Mot   Ax/ R/ PIN/ M/ AIN/ U intact  Rad 2+  Skin:    General:  Skin is warm and dry.  Neurological:     Mental Status: She is alert.  Psychiatric:        Mood and Affect: Mood normal.        Behavior: Behavior normal.     Assessment/Plan: Left hand infection -- Dr. Fredna Dow to evaluate. Please keep NPO for now.    Carol Abu, PA-C Orthopedic Surgery [phone] 06/07/2022, 4:07 PM   Addendum (06/07/22): Patient seen and examined. H: states she was bitten on left wrist by her dog two days ago.  Seen at [facility] where wound cleaned and sutured.  Started on antibiotics.  Has had worsening swelling and pain in hand/wrist since.  No fevers or chills. E: left wrist/hand swollen and tender.  Able to move wrist with minimal pain.  Tender around wound and on dorsum of hand.  No proximal streaking.  Mild erythema. A/P:  left wrist/hand infected dog bite.  Recommend OR for incision and drainage left hand/wrist.  Risks, benefits and alternatives of surgery were discussed including risks of blood loss, infection, damage to nerves/vessels/tendons/ligament/bone, failure of surgery, need for additional surgery, complication with wound healing, stiffness, need for repeat irrigation and debridement.  She voiced understanding of these risks and elected to proceed.

## 2022-06-07 NOTE — Progress Notes (Signed)
Nurse gave hand -off report, patient left unit to OR for procedure.

## 2022-06-07 NOTE — H&P (Signed)
History and Physical    Carol Beasley Carol Beasley DOB: [birth year] DOA: 06/07/2022  PCP: Pablo Lawrence, NP  Patient coming from: Home  I have personally briefly reviewed patient's old medical records available.   Chief Complaint: Left hand swelling after dog bite  HPI: Carol Beasley is a 47 y.o. female with medical history significant of ADHD, insomnia and migraine but no other significant medical history including hypertension or diabetes who was bit by her dog on the dorsum aspect of her left wrist 2 days ago, she was seen in the emergency room she had a gaping laceration that was sutured, started on Augmentin and sent home.  Over the last 2 days there is increasing redness on the dorsum of the wrist and some redness going up to the forearm, increasing pain, about 7 out of 10, throbbing sensation with no radiation.  Difficulty to make fist.  Gradually worsening symptoms so came back to the ER.  Denies any fever or chills, nausea vomiting.  Denies any chest pain or shortness of breath.  Denies any joints pain or swelling.  Denies any abdominal pain, change in bowel or urine habits. ED Course: Hemodynamically stable.  She was found to have cellulitis around the incision line.  Hand x-ray showed minimal amount of soft tissue swelling with some gas.  Bones are intact.  She was given dose of Unasyn after collecting blood cultures.  Case was discussed with hand surgery and patient was transferred to [place]: all systems are reviewed and pertinent positive as per HPI otherwise rest are negative.    Past Medical History:  Diagnosis Date   Adult ADHD    Allergy    Anemia    History of : ? No iron testing done?  Oral iron no help for Hb per pt.  Vit B12 helps (oral/otc) per pt.  Old record shows only one Hb from 2014 and it was normal.   Anxiety and depression    when going through separation was on citalopram temporarily.   Endometriosis    History of  abnormal cervical Pap smear 2022   at GYN in [facility]   History of menorrhagia    Iron deficiency anemia 09/27/2020   +hx menorrhagia. Hemoccults neg x3, starting iron supp, plan set to recheck iron in 4-6 wks.   Migraine syndrome    Uterine leiomyoma    Uterine polyp 2022   at GYN in [facility]   Venous insufficiency of both lower extremities    low Na + compression hose.    Past Surgical History:  Procedure Laterality Date   BREAST LUMPECTOMY W/ NEEDLE LOCALIZATION     DILATATION & CURETTAGE/HYSTEROSCOPY WITH MYOSURE N/A 03/24/2021   Procedure: DILATATION & CURETTAGE/HYSTEROSCOPY WITH MYOSURE;  Surgeon: Servando Salina, MD;  Location: [place];  Service: Gynecology;  Laterality: N/A;   [place] OF UTERUS N/A    1997 and 2002   ENDOMETRIAL ABLATION N/A 03/24/2021   Procedure: ENDOMETRIAL ABLATION;  Surgeon: Servando Salina, MD;  Location: [place];  Service: Gynecology;  Laterality: N/A;   TONSILLECTOMY      Social history   reports that she has never smoked. She has never used smokeless tobacco. She reports that she does not drink alcohol and does not use drugs.  Allergies  Allergen Reactions   Topiramate Other (See Comments)    "Can't function"    Family History  Problem Relation Age of Onset   Cancer  Mother        Ovarian   Hypertension Mother    Irritable bowel syndrome Mother    Crohn's disease Mother    Rheum arthritis Mother    COPD Father    Hypertension Sister    Hypertension Brother      Prior to Admission medications   Medication Sig Start Date End Date Taking? Authorizing Provider  HYDROcodone-acetaminophen (NORCO/VICODIN) 5-325 MG tablet Take 1 tablet by mouth every 8 (eight) hours as needed. 06/22/21   Rosemarie Ax, MD  amoxicillin-clavulanate (AUGMENTIN) 875-125 MG tablet Take 1 tablet by mouth every 12 (twelve) hours for 7 days. 06/06/22 06/13/22  Maudie Flakes, MD  clonazePAM (KLONOPIN) 0.5 MG tablet Take  0.5 mg by mouth at bedtime as needed for anxiety.    [provider]  ibuprofen (ADVIL) 800 MG tablet Take 1 tablet (800 mg total) by mouth every 8 (eight) hours as needed for mild pain or moderate pain. 03/24/21   Servando Salina, MD  lisdexamfetamine (VYVANSE) 40 MG capsule Take 1 capsule (40 mg total) by mouth every morning. 11/24/20   McGowen, Adrian Blackwater, MD  OVER THE COUNTER MEDICATION daily. Iron and zinc. Takes 1 gummy daily.    [provider]  sertraline (ZOLOFT) 25 MG tablet Take 25 mg by mouth at bedtime.    [provider]  zolmitriptan (ZOMIG) 5 MG tablet Take 1 tablet (5 mg total) by mouth as needed for migraine. 09/29/20   Tammi Sou, MD    Physical Exam: Vitals:   06/07/22 1046 06/07/22 1048 06/07/22 1418  BP: (!) 143/98  130/78  Pulse: 76  68  Resp: 18  18  Temp: 98.3 F (36.8 C)  98.1 F (36.7 C)  TempSrc: Oral  Oral  SpO2: 100%  100%  Weight:  79.4 kg   Height:  5\' 4"  (1.626 m)     Constitutional: NAD, calm, comfortable Vitals:   06/07/22 1046 06/07/22 1048 06/07/22 1418  BP: (!) 143/98  130/78  Pulse: 76  68  Resp: 18  18  Temp: 98.3 F (36.8 C)  98.1 F (36.7 C)  TempSrc: Oral  Oral  SpO2: 100%  100%  Weight:  79.4 kg   Height:  5\' 4"  (1.626 m)    Eyes: PERRL, lids and conjunctivae normal ENMT: Mucous membranes are moist.  Respiratory: clear to auscultation bilaterally, no wheezing, no crackles.  Cardiovascular: Regular rate and rhythm, no murmurs / rubs / gallops. No extremity edema. 2+ pedal pulses. No carotid bruits.  Abdomen: no tenderness, no masses palpated. No hepatosplenomegaly. Bowel sounds positive. Neurologic: CN 2-12 grossly intact. Sensation intact, DTR normal. Strength 5/5 in all 4.  Psychiatric: Normal judgment and insight. Alert and oriented x 3. Normal mood.  Musculoskeletal: no clubbing / cyanosis. No joint deformity upper and lower extremities. Good ROM, no contractures. Normal muscle tone.  Skin: Left  wrist dorsum with transverse wound, sutures intact and tight, minimal tenderness and puffy swelling along the dorsum and fingers.  No palpable gas or crepitations.  No palpable abscess.  Redness extending to the distal forearm. Picture in the chart.  Labs on Admission: I have personally reviewed following labs and imaging studies  CBC: Recent Labs  Lab 06/07/22 1130  WBC 7.2  NEUTROABS 5.0  HGB 13.5  HCT 41.0  MCV 91.3  PLT [id number]   Basic Metabolic Panel: Recent Labs  Lab 06/07/22 1130  NA 139  K 3.9  CL 103  CO2 28  GLUCOSE 93  BUN 16  CREATININE 0.77  CALCIUM 9.6   GFR: Estimated Creatinine Clearance: 87.7 mL/min (by C-G formula based on SCr of 0.77 mg/dL). Liver Function Tests: No results for input(s): "AST", "ALT", "ALKPHOS", "BILITOT", "PROT", "ALBUMIN" in the last 168 hours. No results for input(s): "LIPASE", "AMYLASE" in the last 168 hours. No results for input(s): "AMMONIA" in the last 168 hours. Coagulation Profile: No results for input(s): "INR", "PROTIME" in the last 168 hours. Cardiac Enzymes: No results for input(s): "CKTOTAL", "CKMB", "CKMBINDEX", "TROPONINI" in the last 168 hours. BNP (last 3 results) No results for input(s): "PROBNP" in the last 8760 hours. HbA1C: No results for input(s): "HGBA1C" in the last 72 hours. CBG: No results for input(s): "GLUCAP" in the last 168 hours. Lipid Profile: No results for input(s): "CHOL", "HDL", "LDLCALC", "TRIG", "CHOLHDL", "LDLDIRECT" in the last 72 hours. Thyroid Function Tests: No results for input(s): "TSH", "T4TOTAL", "FREET4", "T3FREE", "THYROIDAB" in the last 72 hours. Anemia Panel: No results for input(s): "VITAMINB12", "FOLATE", "FERRITIN", "TIBC", "IRON", "RETICCTPCT" in the last 72 hours. Urine analysis:    Component Value Date/Time   COLORURINE yellow 12/26/2009 1054   APPEARANCEUR Clear 12/26/2009 1054   LABSPEC <1.005 12/26/2009 1054   PHURINE 5.5 12/26/2009 1054   HGBUR negative 12/26/2009  1054   BILIRUBINUR negative 12/26/2009 1054   UROBILINOGEN 0.2 12/26/2009 1054   NITRITE negative 12/26/2009 1054    Radiological Exams on Admission: DG Wrist Complete Left  Result Date: 06/07/2022 CLINICAL DATA:  Recent dog bite.  Swelling EXAM: LEFT WRIST - COMPLETE 3 VIEW COMPARISON:  X-ray 06/05/2022 FINDINGS: No fracture or dislocation. Preserved joint spaces and bone mineralization. There is some dorsal soft tissue swelling and gas to the wrist. No radiopaque foreign body. If there is further concern of bone or other infection, cross-sectional imaging can be performed as clinically indicated IMPRESSION: Dorsal soft tissue swelling and gas.  No acute osseous abnormality Electronically Signed   By: Jill Side M.D.   On: 06/07/2022 12:40   DG Hand Complete Left  Result Date: 06/05/2022 CLINICAL DATA:  Dog bite EXAM: LEFT HAND - COMPLETE 3+ VIEW; LEFT WRIST - COMPLETE 3+ VIEW COMPARISON:  None Available. FINDINGS: There is no evidence of fracture or dislocation. There is no evidence of arthropathy or other focal bone abnormality. Soft tissue defect posterior wrist consistent with given history. No radiopaque foreign bodies. IMPRESSION: No fracture or foreign body. Soft tissue defect consistent with dog bite. Electronically Signed   By: Sammie Bench M.D.   On: 06/05/2022 21:13   DG Wrist Complete Left  Result Date: 06/05/2022 CLINICAL DATA:  Dog bite EXAM: LEFT HAND - COMPLETE 3+ VIEW; LEFT WRIST - COMPLETE 3+ VIEW COMPARISON:  None Available. FINDINGS: There is no evidence of fracture or dislocation. There is no evidence of arthropathy or other focal bone abnormality. Soft tissue defect posterior wrist consistent with given history. No radiopaque foreign bodies. IMPRESSION: No fracture or foreign body. Soft tissue defect consistent with dog bite. Electronically Signed   By: Sammie Bench M.D.   On: 06/05/2022 21:13     Assessment/Plan Principal Problem:   Abscess of left arm Active  Problems:   Migraine with aura   ADD (attention deficit disorder)     1.  Cellulitis and abscess of the left wrist/forearm Animal bite, sutures intact.  Agree with monitoring in the hospital on IV antibiotics given severity of symptoms. Adequate pain medications, oral and IV opiates as needed.  Maintenance IV fluids. Blood  cultures were drawn, continue Unasyn. Case discussed with hand surgery, probably will need suture removal and local debridement. Elevate affected part.  2.  Chronic medical issues including anxiety, ADHD, insomnia and migraine Resume her long-term medications including Klonopin at bedtime as needed Vyvanse every morning Zoloft 25 mg at bedtime Sumatriptan as needed.  DVT prophylaxis: SCDs.  Mobilize. Code Status: Full code Family Communication: None Disposition Plan: Home Consults called: Hand surgery, Dr. Fredna Dow Admission status: Observation.  MedSurg bed.  Patient's necessity to stay in the hospital will be evaluated every day and updated.   Barb Merino MD [facility] Pager [phone]

## 2022-06-07 NOTE — Progress Notes (Signed)
Pharmacy Antibiotic Note  Carol Beasley is a 47 y.o. female admitted on 06/07/2022 with  wound infection .  Pharmacy has been consulted for Unasyn dosing.  Pt was recently on Augmentin for wound infection from dog bite. Wound worsens so admitted for IV abx. Unasyn ordered  Scr<1  Plan: Unasyn 3g IV q8  Height: 5\' 4"  (162.6 cm) Weight: 79.4 kg (175 lb) IBW/kg (Calculated) : 54.7  Temp (24hrs), Avg:98.2 F (36.8 C), Min:98.1 F (36.7 C), Max:98.3 F (36.8 C)  Recent Labs  Lab 06/07/22 1130 06/07/22 1146  WBC 7.2  --   CREATININE 0.77  --   LATICACIDVEN  --  0.5    Estimated Creatinine Clearance: 87.7 mL/min (by C-G formula based on SCr of 0.77 mg/dL).    Allergies  Allergen Reactions   Topiramate Other (See Comments)    "Can't function"    Antimicrobials this admission: 3/21 Unasyn>>   Dose adjustments this admission:   Microbiology results:  3/21 Unasyn>>   Onnie Boer, PharmD, BCIDP, AAHIVP, CPP Infectious Disease Pharmacist 06/07/2022 4:08 PM

## 2022-06-07 NOTE — ED Notes (Signed)
ED TO INPATIENT HANDOFF REPORT  ED Nurse Name and Phone #: Henri Medal [phone]  S Name/Age/Gender Arville Care 47 y.o. female Room/Bed: MHT13/MHT13  Code Status   Code Status: Not on file  Home/SNF/Other Home Patient oriented to: self, place, time, and situation Is this baseline? Yes   Triage Complete: Triage complete  Chief Complaint Abscess of left arm [L02.414]  Triage Note Reports swelling, increased pain, warm to touch and coldness on fingers on left hand after sutures on Tuesday for dog bite.   Allergies Allergies  Allergen Reactions   Topiramate Other (See Comments)    "Can't function"    Level of Care/Admitting Diagnosis ED Disposition     ED Disposition  Admit   Condition  --   Comment  Hospital Area: [facility] [[id number]]  Level of Care: Med-Surg [16]  May admit patient to [facility] or [facility] if equivalent level of care is available:: No  Interfacility transfer: Yes  Covid Evaluation: Asymptomatic - no recent exposure (last 10 days) testing not required  Diagnosis: Abscess of left arm [id number]  Admitting Physician: Lequita Halt [id number]  Attending Physician: Lequita Halt [id number]  Certification:: I certify this patient will need inpatient services for at least 2 midnights  Estimated Length of Stay: 2          B Medical/Surgery History Past Medical History:  Diagnosis Date   Adult ADHD    Allergy    Anemia    History of : ? No iron testing done?  Oral iron no help for Hb per pt.  Vit B12 helps (oral/otc) per pt.  Old record shows only one Hb from 2014 and it was normal.   Anxiety and depression    when going through separation was on citalopram temporarily.   Endometriosis    History of abnormal cervical Pap smear 2022   at GYN in [facility]   History of menorrhagia    Iron deficiency anemia 09/27/2020   +hx menorrhagia. Hemoccults neg x3, starting iron supp, plan set to recheck iron in 4-6 wks.   Migraine  syndrome    Uterine leiomyoma    Uterine polyp 2022   at GYN in [facility]   Venous insufficiency of both lower extremities    low Na + compression hose.   Past Surgical History:  Procedure Laterality Date   BREAST LUMPECTOMY W/ NEEDLE LOCALIZATION     DILATATION & CURETTAGE/HYSTEROSCOPY WITH MYOSURE N/A 03/24/2021   Procedure: DILATATION & CURETTAGE/HYSTEROSCOPY WITH MYOSURE;  Surgeon: Servando Salina, MD;  Location: [facility];  Service: Gynecology;  Laterality: N/A;   [place] OF UTERUS N/A    1997 and 2002   ENDOMETRIAL ABLATION N/A 03/24/2021   Procedure: ENDOMETRIAL ABLATION;  Surgeon: Servando Salina, MD;  Location: [facility];  Service: Gynecology;  Laterality: N/A;   TONSILLECTOMY       A IV Location/Drains/Wounds Patient Lines/Drains/Airways Status     Active Line/Drains/Airways     Name Placement date Placement time Site Days   Peripheral IV 06/07/22 20 G Anterior;Proximal;Right Forearm 06/07/22  1201  Forearm  less than 1            Intake/Output Last 24 hours  Intake/Output Summary (Last 24 hours) at 06/07/2022 1420 Last data filed at 06/07/2022 1319 Gross per 24 hour  Intake 100 ml  Output --  Net 100 ml    Labs/Imaging Results for orders placed or performed during the hospital encounter  of 06/07/22 (from the past 48 hour(s))  CBC with Differential     Status: None   Collection Time: 06/07/22 11:30 AM  Result Value Ref Range   WBC 7.2 4.0 - 10.5 K/uL   RBC 4.49 3.87 - 5.11 MIL/uL   Hemoglobin 13.5 12.0 - 15.0 g/dL   HCT 41.0 36.0 - 46.0 %   MCV 91.3 80.0 - 100.0 fL   MCH 30.1 26.0 - 34.0 pg   MCHC 32.9 30.0 - 36.0 g/dL   RDW 12.9 11.5 - 15.5 %   Platelets 253 150 - 400 K/uL   nRBC 0.0 0.0 - 0.2 %   Neutrophils Relative % 70 %   Neutro Abs 5.0 1.7 - 7.7 K/uL   Lymphocytes Relative 22 %   Lymphs Abs 1.6 0.7 - 4.0 K/uL   Monocytes Relative 7 %   Monocytes Absolute 0.5 0.1 - 1.0 K/uL   Eosinophils  Relative 1 %   Eosinophils Absolute 0.1 0.0 - 0.5 K/uL   Basophils Relative 0 %   Basophils Absolute 0.0 0.0 - 0.1 K/uL   Immature Granulocytes 0 %   Abs Immature Granulocytes 0.02 0.00 - 0.07 K/uL    Comment: Performed at [facility], [address] [place], [place] [id number]  Basic metabolic panel     Status: None   Collection Time: 06/07/22 11:30 AM  Result Value Ref Range   Sodium 139 135 - 145 mmol/L   Potassium 3.9 3.5 - 5.1 mmol/L   Chloride 103 98 - 111 mmol/L   CO2 28 22 - 32 mmol/L   Glucose, Bld 93 70 - 99 mg/dL    Comment: Glucose reference range applies only to samples taken after fasting for at least 8 hours.   BUN 16 6 - 20 mg/dL   Creatinine, Ser 0.77 0.44 - 1.00 mg/dL   Calcium 9.6 8.9 - 10.3 mg/dL   GFR, Estimated >60 >60 mL/min    Comment: (NOTE) Calculated using the CKD-EPI Creatinine Equation (2021)    Anion gap 8 5 - 15    Comment: Performed at [facility], [address], [place], [place] [address]   DG Wrist Complete Left  Result Date: 06/07/2022 CLINICAL DATA:  Recent dog bite.  Swelling EXAM: LEFT WRIST - COMPLETE 3 VIEW COMPARISON:  X-ray 06/05/2022 FINDINGS: No fracture or dislocation. Preserved joint spaces and bone mineralization. There is some dorsal soft tissue swelling and gas to the wrist. No radiopaque foreign body. If there is further concern of bone or other infection, cross-sectional imaging can be performed as clinically indicated IMPRESSION: Dorsal soft tissue swelling and gas.  No acute osseous abnormality Electronically Signed   By: Jill Side M.D.   On: 06/07/2022 12:40   DG Hand Complete Left  Result Date: 06/05/2022 CLINICAL DATA:  Dog bite EXAM: LEFT HAND - COMPLETE 3+ VIEW; LEFT WRIST - COMPLETE 3+ VIEW COMPARISON:  None Available. FINDINGS: There is no evidence of fracture or dislocation. There is no evidence of arthropathy or other focal bone abnormality. Soft tissue defect  posterior wrist consistent with given history. No radiopaque foreign bodies. IMPRESSION: No fracture or foreign body. Soft tissue defect consistent with dog bite. Electronically Signed   By: Sammie Bench M.D.   On: 06/05/2022 21:13   DG Wrist Complete Left  Result Date: 06/05/2022 CLINICAL DATA:  Dog bite EXAM: LEFT HAND - COMPLETE 3+ VIEW; LEFT WRIST - COMPLETE 3+ VIEW COMPARISON:  None Available. FINDINGS: There is no evidence of fracture or dislocation. There is no evidence of arthropathy or other focal bone abnormality. Soft tissue defect posterior wrist consistent with given history. No radiopaque foreign bodies. IMPRESSION: No fracture or foreign body. Soft tissue defect consistent with dog bite. Electronically Signed   By: Sammie Bench M.D.   On: 06/05/2022 21:13    Pending Labs Unresulted Labs (From admission, onward)     Start     Ordered   06/07/22 1146  Lactic acid, plasma  Now then every 2 hours,   R (with STAT occurrences)      06/07/22 1145   06/07/22 1142  Blood culture (routine x 2)  BLOOD CULTURE X 2,   STAT      06/07/22 1141            Vitals/Pain Today's Vitals   06/07/22 1200 06/07/22 1319 06/07/22 1319 06/07/22 1418  BP:    130/78  Pulse:    68  Resp:    18  Temp:    98.1 F (36.7 C)  TempSrc:    Oral  SpO2:    100%  Weight:      Height:      PainSc: 7  3  3       Isolation Precautions No active isolations  Medications Medications  0.9 %  sodium chloride infusion (50 mL/hr Intravenous New Bag/Given 06/07/22 1221)  Ampicillin-Sulbactam (UNASYN) 3 g in sodium chloride 0.9 % 100 mL IVPB (0 g Intravenous Stopped 06/07/22 1319)  morphine (PF) 4 MG/ML injection 4 mg (4 mg Intravenous Given 06/07/22 1218)    Mobility walks     Focused Assessments Cellulitis to left hand worsening since Dog bit on 3/19   R Recommendations: See Admitting Provider Note  Report given to:   Additional Notes: Pt up ad lib, NAD, A&O x 4

## 2022-06-07 NOTE — ED Provider Notes (Addendum)
Carol Beasley Provider Note   CSN: [id number] Arrival date & time: 06/07/22  1036     History  Chief Complaint  Patient presents with   Wound Check    Carol Beasley is a 47 y.o. female.  She has no sniffing past medical history.  Presents the ER with increased redness and swelling to the dorsum of the left wrist at the site of dog bite that she sustained 2 days ago.  She was seen here and due to the gaping laceration it was sutured and she was put on Augmentin.  She states she has had increasing pain and swelling and just since this morning has significant increase in redness to the dorsum of her wrist starting to go up her forearm.  Denies fevers chills or any other systemic symptoms.  She has been compliant with medications.    Wound Check       Home Medications Prior to Admission medications   Medication Sig Start Date End Date Taking? Authorizing Provider  HYDROcodone-acetaminophen (NORCO/VICODIN) 5-325 MG tablet Take 1 tablet by mouth every 8 (eight) hours as needed. 06/22/21   Rosemarie Ax, MD  amoxicillin-clavulanate (AUGMENTIN) 875-125 MG tablet Take 1 tablet by mouth every 12 (twelve) hours for 7 days. 06/06/22 06/13/22  Maudie Flakes, MD  clonazePAM (KLONOPIN) 0.5 MG tablet Take 0.5 mg by mouth at bedtime as needed for anxiety.    [provider]  ibuprofen (ADVIL) 800 MG tablet Take 1 tablet (800 mg total) by mouth every 8 (eight) hours as needed for mild pain or moderate pain. 03/24/21   Servando Salina, MD  lisdexamfetamine (VYVANSE) 40 MG capsule Take 1 capsule (40 mg total) by mouth every morning. 11/24/20   McGowen, Adrian Blackwater, MD  OVER THE COUNTER MEDICATION daily. Iron and zinc. Takes 1 gummy daily.    [provider]  sertraline (ZOLOFT) 25 MG tablet Take 25 mg by mouth at bedtime.    [provider]  zolmitriptan (ZOMIG) 5 MG tablet Take 1 tablet (5 mg total) by mouth as needed for  migraine. 09/29/20   McGowen, Adrian Blackwater, MD      Allergies    Topiramate    Review of Systems   Review of Systems  Physical Exam Updated Vital Signs BP (!) 143/98 (BP Location: Right Arm)   Pulse 76   Temp 98.3 F (36.8 C) (Oral)   Resp 18   Ht 5\' 4"  (1.626 m)   Wt 79.4 kg   SpO2 100%   BMI 30.04 kg/m  Physical Exam Vitals and nursing note reviewed.  Constitutional:      General: She is not in acute distress.    Appearance: She is well-developed.  HENT:     Head: Normocephalic and atraumatic.     Right Ear: Tympanic membrane normal.     Left Ear: Tympanic membrane normal.     Mouth/Throat:     Mouth: Mucous membranes are moist.  Eyes:     Conjunctiva/sclera: Conjunctivae normal.  Cardiovascular:     Rate and Rhythm: Normal rate and regular rhythm.     Heart sounds: No murmur heard. Pulmonary:     Effort: Pulmonary effort is normal. No respiratory distress.     Breath sounds: Normal breath sounds.  Abdominal:     Palpations: Abdomen is soft.     Tenderness: There is no abdominal tenderness.  Musculoskeletal:        General: No swelling.  Cervical back: Neck supple.     Comments: Patient can make okay sign, thumbs up, cross her fingers and extend her left wrist without difficulty, she has pain with flexion of her left wrist  Skin:    General: Skin is warm and dry.     Capillary Refill: Capillary refill takes less than 2 seconds.  Neurological:     General: No focal deficit present.     Mental Status: She is alert and oriented to person, place, and time.  Psychiatric:        Mood and Affect: Mood normal.     ED Results / Procedures / Treatments   Labs (all labs ordered are listed, but only abnormal results are displayed) Labs Reviewed  CULTURE, BLOOD (ROUTINE X 2)  CULTURE, BLOOD (ROUTINE X 2)  CBC WITH DIFFERENTIAL/PLATELET  BASIC METABOLIC PANEL  LACTIC ACID, PLASMA  LACTIC ACID, PLASMA    EKG None  Radiology DG Wrist Complete Left  Result  Date: 06/07/2022 CLINICAL DATA:  Recent dog bite.  Swelling EXAM: LEFT WRIST - COMPLETE 3 VIEW COMPARISON:  X-ray 06/05/2022 FINDINGS: No fracture or dislocation. Preserved joint spaces and bone mineralization. There is some dorsal soft tissue swelling and gas to the wrist. No radiopaque foreign body. If there is further concern of bone or other infection, cross-sectional imaging can be performed as clinically indicated IMPRESSION: Dorsal soft tissue swelling and gas.  No acute osseous abnormality Electronically Signed   By: Jill Side M.D.   On: 06/07/2022 12:40   DG Hand Complete Left  Result Date: 06/05/2022 CLINICAL DATA:  Dog bite EXAM: LEFT HAND - COMPLETE 3+ VIEW; LEFT WRIST - COMPLETE 3+ VIEW COMPARISON:  None Available. FINDINGS: There is no evidence of fracture or dislocation. There is no evidence of arthropathy or other focal bone abnormality. Soft tissue defect posterior wrist consistent with given history. No radiopaque foreign bodies. IMPRESSION: No fracture or foreign body. Soft tissue defect consistent with dog bite. Electronically Signed   By: Sammie Bench M.D.   On: 06/05/2022 21:13   DG Wrist Complete Left  Result Date: 06/05/2022 CLINICAL DATA:  Dog bite EXAM: LEFT HAND - COMPLETE 3+ VIEW; LEFT WRIST - COMPLETE 3+ VIEW COMPARISON:  None Available. FINDINGS: There is no evidence of fracture or dislocation. There is no evidence of arthropathy or other focal bone abnormality. Soft tissue defect posterior wrist consistent with given history. No radiopaque foreign bodies. IMPRESSION: No fracture or foreign body. Soft tissue defect consistent with dog bite. Electronically Signed   By: Sammie Bench M.D.   On: 06/05/2022 21:13    Procedures Procedures    Medications Ordered in ED Medications  0.9 %  sodium chloride infusion (50 mL/hr Intravenous New Bag/Given 06/07/22 1221)  Ampicillin-Sulbactam (UNASYN) 3 g in sodium chloride 0.9 % 100 mL IVPB (0 g Intravenous Stopped 06/07/22  1319)  morphine (PF) 4 MG/ML injection 4 mg (4 mg Intravenous Given 06/07/22 1218)    ED Course/ Medical Decision Making/ A&P                             Medical Decision Making This patient presents to the ED for concern of left wrist swelling and redness, this involves an extensive number of treatment options, and is a complaint that carries with it a high risk of complications and morbidity.  The differential diagnosis includes cellulitis, abscess, doctor, other     Additional history obtained:  Additional  history obtained from EMR External records from outside source obtained and reviewed including recent ED visit for dog bite, including x-ray images and report   Lab Tests:  I Ordered, and personally interpreted labs.  The pertinent results include: CBC and CMP reassuring no acute abnormalities specifically no leukocytosis   Imaging Studies ordered:  I ordered imaging studies including x-ray left wrist I independently visualized and interpreted imaging which showed soft tissue gas appears improved from previous x-ray I agree with the radiologist interpretation     Consultations Obtained:  I requested consultation with the on-call PA for hand/orthopedics Michael,  and discussed lab and imaging findings as well as pertinent plan - they recommend: Hospitalist service, he will see her when she arrives preferably at [facility] in case she needs surgery  Care discussed with hospitalist as well, Dr. Roosevelt Locks who is agreeable with admission   Problem List / ED Course / Critical interventions / Medication management  Cellulitis left hand that is increasing.  Patient had redness that started this morning is gotten worse with increased swelling and pain.  She is already on Augmentin for infection prophylaxis from her wound less than 48 hours ago.  Given failure of outpatient antibiotics and progression of her symptoms plan for admission for IV antibiotics and consultation with hand  surgery I ordered medication including morphine  for pain, unasyn for infection  Reevaluation of the patient after these medicines showed that the patient improved I have reviewed the patients home medicines and have made adjustments as needed      Amount and/or Complexity of Data Reviewed Labs: ordered. Radiology: ordered.  Risk Prescription drug management. Decision regarding hospitalization.           Final Clinical Impression(s) / ED Diagnoses Final diagnoses:  Cellulitis of left wrist    Rx / DC Orders ED Discharge Orders     None         Gwenevere Abbot, PA-C 06/07/22 [address] PA-C 06/07/22 1408    Lorelle Gibbs, DO 06/07/22 1600

## 2022-06-08 ENCOUNTER — Encounter (HOSPITAL_COMMUNITY): Payer: Self-pay | Admitting: Orthopedic Surgery

## 2022-06-08 DIAGNOSIS — L02414 Cutaneous abscess of left upper limb: Secondary | ICD-10-CM | POA: Diagnosis not present

## 2022-06-08 LAB — CBC
HCT: 39.2 % (ref 36.0–46.0)
Hemoglobin: 12.6 g/dL (ref 12.0–15.0)
MCH: 29.7 pg (ref 26.0–34.0)
MCHC: 32.1 g/dL (ref 30.0–36.0)
MCV: 92.5 fL (ref 80.0–100.0)
Platelets: 226 10*3/uL (ref 150–400)
RBC: 4.24 MIL/uL (ref 3.87–5.11)
RDW: 13.1 % (ref 11.5–15.5)
WBC: 7.8 10*3/uL (ref 4.0–10.5)
nRBC: 0 % (ref 0.0–0.2)

## 2022-06-08 LAB — HIV ANTIBODY (ROUTINE TESTING W REFLEX): HIV Screen 4th Generation wRfx: NONREACTIVE

## 2022-06-08 LAB — GLUCOSE, CAPILLARY: Glucose-Capillary: 126 mg/dL — ABNORMAL HIGH (ref 70–99)

## 2022-06-08 MED ORDER — BUPIVACAINE HCL (PF) 0.25 % IJ SOLN
INTRAMUSCULAR | Status: DC | PRN
  Administered 2022-06-08: 9 mg

## 2022-06-08 MED ORDER — OXYCODONE HCL 5 MG PO TABS: 5.0000 mg | ORAL_TABLET | Freq: Four times a day (QID) | ORAL | 0 refills | Status: AC | PRN

## 2022-06-08 MED ORDER — HYDROMORPHONE HCL 1 MG/ML IJ SOLN: 0.2500 mg | INTRAMUSCULAR | Status: DC | PRN

## 2022-06-08 MED ORDER — LACTATED RINGERS IV SOLN: INTRAVENOUS | Status: DC

## 2022-06-08 MED ORDER — VITAMIN C 500 MG PO TABS
1000.0000 mg | ORAL_TABLET | Freq: Every day | ORAL | Status: DC
  Administered 2022-06-08: 1000 mg via ORAL
  Filled 2022-06-08: qty 2

## 2022-06-08 MED ORDER — ACETAMINOPHEN 10 MG/ML IV SOLN
INTRAVENOUS | Status: DC | PRN
  Administered 2022-06-08: 1000 mg via INTRAVENOUS

## 2022-06-08 MED ORDER — PROPOFOL 10 MG/ML IV BOLUS
INTRAVENOUS | Status: AC
  Filled 2022-06-08: qty 20

## 2022-06-08 NOTE — Transfer of Care (Signed)
Immediate Anesthesia Transfer of Care Note  Patient: Carol Beasley  Procedure(s) Performed: INCISION AND DRAINAGE HAND / WRIST (Left)  Patient Location: PACU  Anesthesia Type:General  Level of Consciousness: awake, alert , and oriented  Airway & Oxygen Therapy: Patient Spontanous Breathing  Post-op Assessment: Report given to RN and Post -op Vital signs reviewed and stable  Post vital signs: Reviewed and stable  Last Vitals:  Vitals Value Taken Time  BP 140/76 06/08/22 0030  Temp 36.7 C 06/08/22 0023  Pulse 73 06/08/22 0033  Resp 15 06/08/22 0033  SpO2 99 % 06/08/22 0033  Vitals shown include unvalidated device data.  Last Pain:  Vitals:   06/08/22 0023  TempSrc:   PainSc: 0-No pain      Patients Stated Pain Goal: 0 ([id number])  Complications: No notable events documented.

## 2022-06-08 NOTE — Progress Notes (Signed)
Postop note: Status post incision and drainage left wrist for infected dog bite abscess.  Watery fluid encountered.  No gross purulence.  Cultures taken.  Wound packed.  Okay to transition to oral antibiotics and discharge home when white blood count normalizing, afebrile, no streaking in the forearm/arm.  Follow-up in office on Monday.

## 2022-06-08 NOTE — Progress Notes (Signed)
Patient arrived back to unit.

## 2022-06-08 NOTE — Anesthesia Procedure Notes (Addendum)
Procedure Name: LMA Insertion Date/Time: 06/07/2022 11:37 PM  Performed by: Suzy Bouchard, CRNAPre-anesthesia Checklist: Patient identified, Emergency Drugs available, Suction available, Patient being monitored and Timeout performed Patient Re-evaluated:Patient Re-evaluated prior to induction Oxygen Delivery Method: Circle system utilized Preoxygenation: Pre-oxygenation with 100% oxygen Induction Type: IV induction Ventilation: Mask ventilation without difficulty LMA: LMA inserted LMA Size: 4.0 Number of attempts: 1 Placement Confirmation: positive ETCO2 and breath sounds checked- equal and bilateral Tube secured with: Tape Dental Injury: Teeth and Oropharynx as per pre-operative assessment

## 2022-06-08 NOTE — Anesthesia Postprocedure Evaluation (Signed)
Anesthesia Post Note  Patient: Carol Beasley  Procedure(s) Performed: INCISION AND DRAINAGE HAND / WRIST (Left)     Patient location during evaluation: PACU Anesthesia Type: General Level of consciousness: awake and alert Pain management: pain level controlled Vital Signs Assessment: post-procedure vital signs reviewed and stable Respiratory status: spontaneous breathing, nonlabored ventilation and respiratory function stable Cardiovascular status: blood pressure returned to baseline and stable Postop Assessment: no apparent nausea or vomiting Anesthetic complications: no  No notable events documented.  Last Vitals:  Vitals:   06/08/22 0045 06/08/22 0101  BP: (!) 143/73 (!) 150/84  Pulse: 66 64  Resp: 13 18  Temp: 36.7 C 36.7 C  SpO2: 96% 98%    Last Pain:  Vitals:   06/08/22 0101  TempSrc: Oral  PainSc:                  Deasiah Hagberg,W. EDMOND

## 2022-06-08 NOTE — Op Note (Signed)
NAME: Carol Beasley MEDICAL RECORD NO: [id number] DATE OF BIRTH: 1975/12/31 FACILITY: [facility] LOCATION: [facility] OR PHYSICIAN: Tennis Must, MD   OPERATIVE REPORT   DATE OF PROCEDURE: 06/08/22    PREOPERATIVE DIAGNOSIS: Left wrist infected dog bite abscess   POSTOPERATIVE DIAGNOSIS: Left wrist infected dog bite abscess   PROCEDURE: Vision and drainage left wrist infected dog bite abscess   SURGEON:  Leanora Cover, M.D.   ASSISTANT: none   ANESTHESIA:  General   INTRAVENOUS FLUIDS:  Per anesthesia flow sheet.   ESTIMATED BLOOD LOSS:  Minimal.   COMPLICATIONS:  None.   SPECIMENS: Cultures to micro   TOURNIQUET TIME:   Left arm: 15 minutes at 250 mmHg   DISPOSITION:  Stable to PACU.   INDICATIONS: 47 year old female states she was bitten on the left wrist by her dog 2 days ago.  [facility] where the wound was cleaned and sutured.  She was started on antibiotics.  She has had progressive swelling and pain since.  No fevers or chills.  Recommended incision and drainage in the operating room.  Risks, benefits and alternatives of surgery were discussed including the risks of blood loss, infection, damage to nerves, vessels, tendons, ligaments, bone for surgery, need for additional surgery, complications with wound healing, continued pain, stiffness, , need for repeat irrigation and debridement.  She voiced understanding of these risks and elected to proceed.  OPERATIVE COURSE:  After being identified preoperatively by myself,  the patient and I agreed on the procedure and site of the procedure.  The surgical site was marked.  Surgical consent had been signed.  She is on scheduled antibiotics.  She was transferred to the operating room and placed on the operating table in supine position with the Left upper extremity on an arm board.  General anesthesia was induced by the anesthesiologist.  Left upper extremity was prepped and draped in normal sterile  orthopedic fashion.  A surgical pause was performed between the surgeons, anesthesia, and operating room staff and all were in agreement as to the patient, procedure, and site of procedure.  Tourniquet at the proximal aspect of the extremity was inflated to 250 mmHg after exsanguination of the arm with an Esmarch bandage.  The wound was opened.  There was watery fluid.  No gross purulence.  The extensor retinaculum appeared to be violated.  This was opened over the fourth dorsal compartment and second dorsal compartment tendons.  Cultures were taken for aerobes and nerves.  There was watery fluid within the fourth dorsal compartment.  There was a pocket distally in the subcutaneous tissues of the hand.  This was opened.  Again no gross purulence.  The area was milked and no purulence or fluid was found from proximal or distal.  The wound was copiously irrigated with sterile saline by bulb syringe.  It was then packed with iodoform gauze.  It was injected with quarter percent plain Marcaine to aid in postoperative analgesia.  It was dressed with sterile 4 x 4's and wrapped with a Kerlix bandage.  Volar splint is placed and wrapped with Kerlix and Ace bandage.  The tourniquet was deflated at 15 minutes.  Fingertips were pink with brisk capillary refill after deflation of tourniquet.  The operative  drapes were broken down.  The patient was awoken from anesthesia safely.  She was transferred back to the stretcher and taken to PACU in stable condition.  She is currently admitted to the hospitalist service.  I will see her back in the office in 3-4 days for postoperative followup.     Leanora Cover, MD Electronically signed, 06/08/22

## 2022-06-08 NOTE — Plan of Care (Signed)
  Problem: Education: Goal: Knowledge of General Education information will improve Description: Including pain rating scale, medication(s)/side effects and non-pharmacologic comfort measures 06/08/2022 1107 by Andrey Campanile, RN Outcome: Adequate for Discharge 06/08/2022 0904 by Andrey Campanile, RN Outcome: Progressing   Problem: Health Behavior/Discharge Planning: Goal: Ability to manage health-related needs will improve 06/08/2022 1107 by Andrey Campanile, RN Outcome: Adequate for Discharge 06/08/2022 0904 by Andrey Campanile, RN Outcome: Progressing   Problem: Clinical Measurements: Goal: Ability to maintain clinical measurements within normal limits will improve 06/08/2022 1107 by Andrey Campanile, RN Outcome: Adequate for Discharge 06/08/2022 0904 by Andrey Campanile, RN Outcome: Progressing Goal: Will remain free from infection 06/08/2022 1107 by Andrey Campanile, RN Outcome: Adequate for Discharge 06/08/2022 0904 by Andrey Campanile, RN Outcome: Progressing Goal: Diagnostic test results will improve 06/08/2022 1107 by Andrey Campanile, RN Outcome: Adequate for Discharge 06/08/2022 0904 by Andrey Campanile, RN Outcome: Progressing Goal: Respiratory complications will improve 06/08/2022 1107 by Andrey Campanile, RN Outcome: Adequate for Discharge 06/08/2022 0904 by Andrey Campanile, RN Outcome: Progressing Goal: Cardiovascular complication will be avoided 06/08/2022 1107 by Andrey Campanile, RN Outcome: Adequate for Discharge 06/08/2022 0904 by Andrey Campanile, RN Outcome: Progressing   Problem: Activity: Goal: Risk for activity intolerance will decrease 06/08/2022 1107 by Andrey Campanile, RN Outcome: Adequate for Discharge 06/08/2022 0904 by Andrey Campanile, RN Outcome: Progressing   Problem: Nutrition: Goal: Adequate nutrition will be maintained 06/08/2022 1107 by Andrey Campanile, RN Outcome: Adequate for Discharge 06/08/2022 0904 by Andrey Campanile, RN Outcome: Progressing   Problem: Coping: Goal: Level of anxiety will decrease 06/08/2022 1107 by Andrey Campanile, RN Outcome: Adequate for Discharge 06/08/2022 0904 by Andrey Campanile, RN Outcome: Progressing   Problem: Elimination: Goal: Will not experience complications related to bowel motility 06/08/2022 1107 by Andrey Campanile, RN Outcome: Adequate for Discharge 06/08/2022 0904 by Andrey Campanile, RN Outcome: Progressing Goal: Will not experience complications related to urinary retention 06/08/2022 1107 by Andrey Campanile, RN Outcome: Adequate for Discharge 06/08/2022 0904 by Andrey Campanile, RN Outcome: Progressing   Problem: Pain Managment: Goal: General experience of comfort will improve 06/08/2022 1107 by Andrey Campanile, RN Outcome: Adequate for Discharge 06/08/2022 0904 by Andrey Campanile, RN Outcome: Progressing   Problem: Safety: Goal: Ability to remain free from injury will improve 06/08/2022 1107 by Andrey Campanile, RN Outcome: Adequate for Discharge 06/08/2022 0904 by Andrey Campanile, RN Outcome: Progressing   Problem: Skin Integrity: Goal: Risk for impaired skin integrity will decrease 06/08/2022 1107 by Andrey Campanile, RN Outcome: Adequate for Discharge 06/08/2022 0904 by Andrey Campanile, RN Outcome: Progressing

## 2022-06-08 NOTE — Plan of Care (Signed)

## 2022-06-08 NOTE — Discharge Summary (Signed)
Physician Discharge Summary  Carol Beasley [id number] DOB: [birth year] DOA: 06/07/2022  PCP: Pablo Lawrence, NP  Admit date: 06/07/2022 Discharge date: 06/08/2022  Admitted From: Home Disposition: Home  Recommendations for Outpatient Follow-up:  Follow-up with hand surgery 3/25 as scheduled  Home Health: N/A Equipment/Devices: N/A  Discharge Condition: Stable CODE STATUS: Full code Diet recommendation: Regular diet  Discharge summary: 47 year old with history of ADHD, insomnia and migraine had a dog bite on her left dorsum of the wrist 2 days ago, came to the emergency room cleaned and sutured and sent home on Augmentin came back with worsening pain and swelling of the fingers.  started on Unasyn.  Sent to the hospital for admission and hand surgery evaluation.  Taken to operating room overnight for debridement.  Wound was opened.  Some watery fluid removed with no gross purulence.  Cultures were taken.  Multiple pockets were opened, volar splint was placed.  As per surgery recommendation, going home today, will continue Augmentin and pain medications.  Elevate left wrist.  Dressing to remain intact until follow-up in the office that is scheduled for 3/25.   Discharge Diagnoses:  Principal Problem:   Abscess of left arm Active Problems:   Migraine with aura   ADD (attention deficit disorder)    Discharge Instructions  Discharge Instructions     Diet - low sodium heart healthy   Complete by: As directed    Discharge instructions   Complete by: As directed    Elevate affected hand   Increase activity slowly   Complete by: As directed    Leave dressing on - Keep it clean, dry, and intact until clinic visit   Complete by: As directed       Allergies as of 06/08/2022       Reactions   Topiramate Other (See Comments)   "Can't function"        Medication List     STOP taking these medications    HYDROcodone-acetaminophen 5-325 MG tablet Commonly known as:  NORCO/VICODIN       TAKE these medications    amoxicillin-clavulanate 875-125 MG tablet Commonly known as: AUGMENTIN Take 1 tablet by mouth every 12 (twelve) hours for 7 days.   clonazePAM 0.5 MG tablet Commonly known as: KLONOPIN Take 0.5 mg by mouth at bedtime as needed for anxiety.   ibuprofen 800 MG tablet Commonly known as: ADVIL Take 1 tablet (800 mg total) by mouth every 8 (eight) hours as needed for mild pain or moderate pain.   lisdexamfetamine 40 MG capsule Commonly known as: Vyvanse Take 1 capsule (40 mg total) by mouth every morning. What changed: when to take this   oxyCODONE 5 MG immediate release tablet Commonly known as: Oxy IR/ROXICODONE Take 1 tablet (5 mg total) by mouth every 6 (six) hours as needed for moderate pain or severe pain.   sertraline 25 MG tablet Commonly known as: ZOLOFT Take 25 mg by mouth at bedtime.   zolmitriptan 5 MG tablet Commonly known as: ZOMIG Take 1 tablet (5 mg total) by mouth as needed for migraine.               Discharge Care Instructions  (From admission, onward)           Start     Ordered   06/08/22 0000  Leave dressing on - Keep it clean, dry, and intact until clinic visit        06/08/22 1058  Follow-up Information     Leanora Cover, MD Follow up on 06/11/2022.   Specialty: Orthopedic Surgery Contact information: [place] [address] [phone]                Allergies  Allergen Reactions   Topiramate Other (See Comments)    "Can't function"    Consultations: Hand surgery   Procedures/Studies: DG Wrist Complete Left  Result Date: 06/07/2022 CLINICAL DATA:  Recent dog bite.  Swelling EXAM: LEFT WRIST - COMPLETE 3 VIEW COMPARISON:  X-ray 06/05/2022 FINDINGS: No fracture or dislocation. Preserved joint spaces and bone mineralization. There is some dorsal soft tissue swelling and gas to the wrist. No radiopaque foreign body. If there is further concern  of bone or other infection, cross-sectional imaging can be performed as clinically indicated IMPRESSION: Dorsal soft tissue swelling and gas.  No acute osseous abnormality Electronically Signed   By: Jill Side M.D.   On: 06/07/2022 12:40   DG Hand Complete Left  Result Date: 06/05/2022 CLINICAL DATA:  Dog bite EXAM: LEFT HAND - COMPLETE 3+ VIEW; LEFT WRIST - COMPLETE 3+ VIEW COMPARISON:  None Available. FINDINGS: There is no evidence of fracture or dislocation. There is no evidence of arthropathy or other focal bone abnormality. Soft tissue defect posterior wrist consistent with given history. No radiopaque foreign bodies. IMPRESSION: No fracture or foreign body. Soft tissue defect consistent with dog bite. Electronically Signed   By: Sammie Bench M.D.   On: 06/05/2022 21:13   DG Wrist Complete Left  Result Date: 06/05/2022 CLINICAL DATA:  Dog bite EXAM: LEFT HAND - COMPLETE 3+ VIEW; LEFT WRIST - COMPLETE 3+ VIEW COMPARISON:  None Available. FINDINGS: There is no evidence of fracture or dislocation. There is no evidence of arthropathy or other focal bone abnormality. Soft tissue defect posterior wrist consistent with given history. No radiopaque foreign bodies. IMPRESSION: No fracture or foreign body. Soft tissue defect consistent with dog bite. Electronically Signed   By: Sammie Bench M.D.   On: 06/05/2022 21:13   (Echo, Carotid, EGD, Colonoscopy, ERCP)    Subjective: Patient seen in the morning rounds.  Occasional pain persist.  Wrist has some pain, fingers are swollen but better than yesterday.  Eager to go home.   Discharge Exam: Vitals:   06/08/22 0400 06/08/22 0738  BP: 123/73 107/65  Pulse: 64 60  Resp: 16 18  Temp:  (!) 97.5 F (36.4 C)  SpO2: 99% 99%   Vitals:   06/08/22 0101 06/08/22 0201 06/08/22 0400 06/08/22 0738  BP: (!) 150/84 (!) 123/56 123/73 107/65  Pulse: 64 66 64 60  Resp: 18 16 16 18   Temp: 98.1 F (36.7 C) 98.1 F (36.7 C)  (!) 97.5 F (36.4 C)   TempSrc: Oral Oral  Oral  SpO2: 98% 98% 99% 99%  Weight:      Height:        General: Pt is alert, awake, not in acute distress Extremities:  Left forearm on volar splint and Ace bandage.  Minimal swelling of the fingers.  Neurovascular status intact.  Able to make fist.    The results of significant diagnostics from this hospitalization (including imaging, microbiology, ancillary and laboratory) are listed below for reference.     Microbiology: Recent Results (from the past 240 hour(s))  Blood culture (routine x 2)     Status: None (Preliminary result)   Collection Time: 06/07/22 11:42 AM   Specimen: BLOOD  Result Value Ref Range Status  Specimen Description   Final    BLOOD RIGHT ANTECUBITAL Performed at [facility], [address] [place], [place] [address]    Special Requests   Final    BOTTLES DRAWN AEROBIC AND ANAEROBIC Blood Culture adequate volume Performed at [facility], [address] [place], [place] [address]    Culture   Final    NO GROWTH < 24 HOURS Performed at [facility], [place] [address] [place], [place] [address]    Report Status PENDING  Incomplete  Blood culture (routine x 2)     Status: None (Preliminary result)   Collection Time: 06/07/22 11:47 AM   Specimen: BLOOD  Result Value Ref Range Status   Specimen Description   Final    BLOOD RIGHT ANTECUBITAL Performed at [facility], [address] [place], [place] [address]    Special Requests   Final    BOTTLES DRAWN AEROBIC AND ANAEROBIC Blood Culture adequate volume Performed at [facility], [address] [place], [place] [address]    Culture   Final    NO GROWTH < 24 HOURS Performed at [facility], [place] [address] [place], [place] [address]    Report Status PENDING  Incomplete  Aerobic/Anaerobic Culture w Gram Stain (surgical/deep wound)     Status: None (Preliminary result)   Collection Time: 06/07/22 11:53 PM   Specimen:  Wound  Result Value Ref Range Status   Specimen Description WOUND  Final   Special Requests LEFT WRIST WOUND SPEC A  Final   Gram Stain   Final    NO WBC SEEN NO ORGANISMS SEEN Performed at [facility], [address], [place], [place] [address]    Culture PENDING  Incomplete   Report Status PENDING  Incomplete     Labs: BNP (last 3 results) No results for input(s): "BNP" in the last 8760 hours. Basic Metabolic Panel: Recent Labs  Lab 06/07/22 1130  NA 139  K 3.9  CL 103  CO2 28  GLUCOSE 93  BUN 16  CREATININE 0.77  CALCIUM 9.6   Liver Function Tests: No results for input(s): "AST", "ALT", "ALKPHOS", "BILITOT", "PROT", "ALBUMIN" in the last 168 hours. No results for input(s): "LIPASE", "AMYLASE" in the last 168 hours. No results for input(s): "AMMONIA" in the last 168 hours. CBC: Recent Labs  Lab 06/07/22 1130 06/08/22 0550  WBC 7.2 7.8  NEUTROABS 5.0  --   HGB 13.5 12.6  HCT 41.0 39.2  MCV 91.3 92.5  PLT 253 226   Cardiac Enzymes: No results for input(s): "CKTOTAL", "CKMB", "CKMBINDEX", "TROPONINI" in the last 168 hours. BNP: Invalid input(s): "POCBNP" CBG: Recent Labs  Lab 06/08/22 0813  GLUCAP 126*   D-Dimer No results for input(s): "DDIMER" in the last 72 hours. Hgb A1c No results for input(s): "HGBA1C" in the last 72 hours. Lipid Profile No results for input(s): "CHOL", "HDL", "LDLCALC", "TRIG", "CHOLHDL", "LDLDIRECT" in the last 72 hours. Thyroid function studies No results for input(s): "TSH", "T4TOTAL", "T3FREE", "THYROIDAB" in the last 72 hours.  Invalid input(s): "FREET3" Anemia work up No results for input(s): "VITAMINB12", "FOLATE", "FERRITIN", "TIBC", "IRON", "RETICCTPCT" in the last 72 hours. Urinalysis    Component Value Date/Time   COLORURINE yellow 12/26/2009 1054   APPEARANCEUR Clear 12/26/2009 1054   LABSPEC <1.005 12/26/2009 1054   PHURINE 5.5 12/26/2009 1054   HGBUR negative 12/26/2009 1054   BILIRUBINUR negative  12/26/2009  1054   UROBILINOGEN 0.2 12/26/2009 1054   NITRITE negative 12/26/2009 1054   Sepsis Labs Recent Labs  Lab 06/07/22 1130 06/08/22 0550  WBC 7.2 7.8   Microbiology Recent Results (from the past 240 hour(s))  Blood culture (routine x 2)     Status: None (Preliminary result)   Collection Time: 06/07/22 11:42 AM   Specimen: BLOOD  Result Value Ref Range Status   Specimen Description   Final    BLOOD RIGHT ANTECUBITAL Performed at [facility], [address] [place], [place] [address]    Special Requests   Final    BOTTLES DRAWN AEROBIC AND ANAEROBIC Blood Culture adequate volume Performed at [facility], [address] [place], [place] [address]    Culture   Final    NO GROWTH < 24 HOURS Performed at [facility], [place] [address], [place], [place] [address]    Report Status PENDING  Incomplete  Blood culture (routine x 2)     Status: None (Preliminary result)   Collection Time: 06/07/22 11:47 AM   Specimen: BLOOD  Result Value Ref Range Status   Specimen Description   Final    BLOOD RIGHT ANTECUBITAL Performed at [facility], [address] [place], [place] [address]    Special Requests   Final    BOTTLES DRAWN AEROBIC AND ANAEROBIC Blood Culture adequate volume Performed at [facility], [address] [place], [place] [address]    Culture   Final    NO GROWTH < 24 HOURS Performed at [facility], [place] [address], [place], [place] [address]    Report Status PENDING  Incomplete  Aerobic/Anaerobic Culture w Gram Stain (surgical/deep wound)     Status: None (Preliminary result)   Collection Time: 06/07/22 11:53 PM   Specimen: Wound  Result Value Ref Range Status   Specimen Description WOUND  Final   Special Requests LEFT WRIST WOUND SPEC A  Final   Gram Stain   Final    NO WBC SEEN NO ORGANISMS SEEN Performed at [facility], [address], [place], [place] [address]    Culture PENDING   Incomplete   Report Status PENDING  Incomplete     Time coordinating discharge:  32 minutes  SIGNED:   Barb Merino, MD  [facility] 06/08/2022, 10:58 AM

## 2022-06-08 NOTE — Discharge Instructions (Signed)

## 2022-06-12 LAB — CULTURE, BLOOD (ROUTINE X 2)
Culture: NO GROWTH
Culture: NO GROWTH
Special Requests: ADEQUATE
Special Requests: ADEQUATE

## 2022-06-13 LAB — AEROBIC/ANAEROBIC CULTURE W GRAM STAIN (SURGICAL/DEEP WOUND)
Culture: NO GROWTH
Gram Stain: NONE SEEN

## 2022-07-02 ENCOUNTER — Encounter: Payer: Self-pay | Admitting: *Deleted
